# Patient Record
Sex: Female | Born: 1980 | Race: Black or African American | Hispanic: No | Marital: Single | Smoking: Current every day smoker
Health system: Southern US, Community
[De-identification: ages and names within clinical notes are randomized; demographics above are authoritative.]

## PROBLEM LIST (undated history)

## (undated) DIAGNOSIS — I10 Essential (primary) hypertension: Secondary | ICD-10-CM

---

## 2018-07-25 ENCOUNTER — Ambulatory Visit (HOSPITAL_COMMUNITY): Admission: EM | Admit: 2018-07-25 | Discharge: 2018-07-25 | Payer: Self-pay

## 2018-07-25 ENCOUNTER — Other Ambulatory Visit: Payer: Self-pay

## 2018-07-25 ENCOUNTER — Encounter (HOSPITAL_COMMUNITY): Payer: Self-pay | Admitting: *Deleted

## 2018-07-25 ENCOUNTER — Emergency Department (HOSPITAL_COMMUNITY)
Admission: EM | Admit: 2018-07-25 | Discharge: 2018-07-25 | Disposition: A | Discharge status: Home / Self Care | Payer: Self-pay | Attending: Emergency Medicine | Admitting: Emergency Medicine

## 2018-07-25 DIAGNOSIS — H1031 Unspecified acute conjunctivitis, right eye: Secondary | ICD-10-CM | POA: Insufficient documentation

## 2018-07-25 DIAGNOSIS — Z87891 Personal history of nicotine dependence: Secondary | ICD-10-CM | POA: Insufficient documentation

## 2018-07-25 MED ORDER — TOBRAMYCIN 0.3 % OP SOLN: 2.0000 [drp] | OPHTHALMIC | 0 refills | Status: DC

## 2018-07-25 NOTE — ED Notes (Signed)
Declined W/C at D/C and was escorted to lobby by RN. 

## 2018-07-25 NOTE — Discharge Instructions (Signed)
Return if any problems.

## 2018-07-25 NOTE — ED Triage Notes (Signed)
PT reports drainage from RT eye . Pt woke up with eye lid stuck together.

## 2018-07-26 NOTE — ED Provider Notes (Signed)
[facility] EMERGENCY DEPARTMENT Provider Note   CSN: [id number] Arrival date & time: 07/25/18  1625     History   Chief Complaint Chief Complaint  Patient presents with  . Eye Problem    HPI April Watkins is a 37 y.o. female.  The history is provided by the patient. A language interpreter was used.  Eye Problem   This is a new problem. The problem occurs constantly. The problem has been gradually worsening. There is a problem in the right eye. There was no injury mechanism. The pain is moderate. Associated symptoms include discharge and eye redness. Pertinent negatives include no blurred vision. She has tried nothing for the symptoms. The treatment provided no relief.    History reviewed. No pertinent past medical history.  There are no active problems to display for this patient.   History reviewed. No pertinent surgical history.   OB History    Gravida  1   Para      Term      Preterm      AB      Living        SAB      TAB      Ectopic      Multiple      Live Births               Home Medications    Prior to Admission medications   Medication Sig Start Date End Date Taking? Authorizing Provider  tobramycin (TOBREX) 0.3 % ophthalmic solution Place 2 drops into the right eye every 4 (four) hours. 07/25/18   Elson Areas, PA-C    Family History History reviewed. No pertinent family history.  Social History Social History   Tobacco Use  . Smoking status: Former Games developer  . Smokeless tobacco: Never Used  Substance Use Topics  . Alcohol use: Yes    Comment: social  . Drug use: Never     Allergies   Patient has no known allergies.   Review of Systems Review of Systems  Eyes: Positive for pain, discharge and redness. Negative for blurred vision.  All other systems reviewed and are negative.    Physical Exam Updated Vital Signs BP (!) 150/119 (BP Location: Left Arm)   Pulse 80   Temp 99.1 F (37.3 C)  (Oral)   Resp 16   Ht 5\' 2"  (1.575 m)   LMP 06/27/2018   SpO2 100%   Physical Exam  Constitutional: She appears well-developed and well-nourished.  HENT:  Head: Normocephalic.  Eyes: Pupils are equal, round, and reactive to light.  Injected conjunctiva   Neck: Normal range of motion.  Cardiovascular: Normal rate.  Pulmonary/Chest: Effort normal.  Musculoskeletal: Normal range of motion.  Neurological: She is alert.  Nursing note and vitals reviewed.    ED Treatments / Results  Labs (all labs ordered are listed, but only abnormal results are displayed) Labs Reviewed - No data to display  EKG None  Radiology No results found.  Procedures Procedures (including critical care time)  Medications Ordered in ED Medications - No data to display   Initial Impression / Assessment and Plan / ED Course  I have reviewed the triage vital signs and the nursing notes.  Pertinent labs & imaging results that were available during my care of the patient were reviewed by me and considered in my medical decision making (see chart for details).     MDM  Pt counseled on eye infection.  Rx  for tobrex.   Final Clinical Impressions(s) / ED Diagnoses   Final diagnoses:  Acute conjunctivitis of right eye, unspecified acute conjunctivitis type    ED Discharge Orders         Ordered    tobramycin (TOBREX) 0.3 % ophthalmic solution  Every 4 hours     07/25/18 1755        An After Visit Summary was printed and given to the patient.   Elson Areas, PA-C 07/26/18 4782    Gwyneth Sprout, MD 07/31/18 1422

## 2018-11-07 ENCOUNTER — Encounter (HOSPITAL_COMMUNITY): Payer: Self-pay

## 2018-11-07 ENCOUNTER — Ambulatory Visit (HOSPITAL_COMMUNITY)
Admission: EM | Admit: 2018-11-07 | Discharge: 2018-11-07 | Disposition: A | Discharge status: Home / Self Care | Payer: BLUE CROSS/BLUE SHIELD | Attending: Family Medicine | Admitting: Family Medicine

## 2018-11-07 DIAGNOSIS — K0889 Other specified disorders of teeth and supporting structures: Secondary | ICD-10-CM

## 2018-11-07 HISTORY — DX: Essential (primary) hypertension: I10

## 2018-11-07 MED ORDER — PENICILLIN V POTASSIUM 500 MG PO TABS: 500.0000 mg | ORAL_TABLET | Freq: Three times a day (TID) | ORAL | 0 refills | Status: DC

## 2018-11-07 MED ORDER — HYDROCODONE-ACETAMINOPHEN 5-325 MG PO TABS: 1.0000 | ORAL_TABLET | Freq: Four times a day (QID) | ORAL | 0 refills | Status: DC | PRN

## 2018-11-07 NOTE — ED Triage Notes (Signed)
Pt c/ tooth ache to lt lower tooth with abscess to lt side of face, swelling noted

## 2018-11-07 NOTE — Discharge Instructions (Addendum)
Be aware, pain medications may cause drowsiness. Please do not drive, operate heavy machinery or make important decisions while on this medication, it can cloud your judgement.  

## 2018-11-10 NOTE — ED Provider Notes (Signed)
[facility]   [id number] 11/07/18 Arrival Time: 1757  ASSESSMENT & PLAN:  1. Pain, dental     Meds ordered this encounter  Medications  . penicillin v potassium (VEETID) 500 MG tablet    Sig: Take 1 tablet (500 mg total) by mouth 3 (three) times daily.    Dispense:  30 tablet    Refill:  0  . HYDROcodone-acetaminophen (NORCO/VICODIN) 5-325 MG tablet    Sig: Take 1 tablet by mouth every 6 (six) hours as needed for moderate pain or severe pain.    Dispense:  8 tablet    Refill:  0   [place]rolled Substances Registry consulted for this patient. I feel the risk/benefit ratio today is favorable for proceeding with this prescription for a controlled substance. Medication sedation precautions given.  Dental resource written instructions given. She will schedule dental evaluation as soon as possible.  Reviewed expectations re: course of current medical issues. Questions answered. Outlined signs and symptoms indicating need for more acute intervention. Patient verbalized understanding. After Visit Summary given.   SUBJECTIVE:  April Watkins is a 38 y.o. female who reports gradual onset of left upper dental pain described as aching. Present for a few days; worse since yesterday. "Tooth broke recently." Fever: absent. Tolerating PO intake but reports pain with chewing. Normal swallowing. She does not see a dentist regularly. No neck swelling or pain. OTC analgesics without relief.  ROS: As per HPI.  OBJECTIVE: Vitals:   11/07/18 1841  BP: (!) 151/106  Pulse: 86  Resp: 18  Temp: 98.3 F (36.8 C)  TempSrc: Oral  SpO2: 98%    General appearance: alert; no distress HENT: normocephalic; atraumatic; dentition: poor; left upper gums without areas of fluctuance, drainage, or bleeding but tender to palpation; normal jaw movement without difficulty Neck: supple without LAD; FROM; trachea midline Lungs: normal respirations; unlabored Skin: warm and dry Psychological: alert  and cooperative; normal mood and affect  No Known Allergies  Past Medical History:  Diagnosis Date  . Hypertension    Social History   Socioeconomic History  . Marital status: Single    Spouse name: Not on file  . Number of children: Not on file  . Years of education: Not on file  . Highest education level: Not on file  Occupational History  . Not on file  Social Needs  . Financial resource strain: Not on file  . Food insecurity:    Worry: Not on file    Inability: Not on file  . Transportation needs:    Medical: Not on file    Non-medical: Not on file  Tobacco Use  . Smoking status: Former Games developer  . Smokeless tobacco: Never Used  Substance and Sexual Activity  . Alcohol use: Yes    Comment: social  . Drug use: Never  . Sexual activity: Yes    Birth control/protection: None  Lifestyle  . Physical activity:    Days per week: Not on file    Minutes per session: Not on file  . Stress: Not on file  Relationships  . Social connections:    Talks on phone: Not on file    Gets together: Not on file    Attends religious service: Not on file    Active member of club or organization: Not on file    Attends meetings of clubs or organizations: Not on file    Relationship status: Not on file  . Intimate partner violence:    Fear of current or  ex partner: Not on file    Emotionally abused: Not on file    Physically abused: Not on file    Forced sexual activity: Not on file  Other Topics Concern  . Not on file  Social History Narrative  . Not on file    History reviewed. No pertinent surgical history.   Mardella LaymanHagler, Esti Demello, MD 11/10/18 1010

## 2018-11-15 ENCOUNTER — Encounter (HOSPITAL_COMMUNITY): Payer: Self-pay | Admitting: Emergency Medicine

## 2018-11-15 ENCOUNTER — Ambulatory Visit (HOSPITAL_COMMUNITY)
Admission: EM | Admit: 2018-11-15 | Discharge: 2018-11-15 | Disposition: A | Discharge status: Home / Self Care | Payer: BLUE CROSS/BLUE SHIELD | Attending: Family Medicine | Admitting: Family Medicine

## 2018-11-15 DIAGNOSIS — K529 Noninfective gastroenteritis and colitis, unspecified: Secondary | ICD-10-CM | POA: Diagnosis not present

## 2018-11-15 MED ORDER — ONDANSETRON 4 MG PO TBDP: 4.0000 mg | ORAL_TABLET | Freq: Three times a day (TID) | ORAL | 0 refills | Status: DC | PRN

## 2018-11-15 NOTE — Discharge Instructions (Signed)

## 2018-11-15 NOTE — ED Triage Notes (Signed)
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 Worried she ate something bad.

## 2018-11-20 NOTE — ED Provider Notes (Signed)
[facility]   [id number] 11/15/18 Arrival Time: 1943  ASSESSMENT & PLAN:  1. Gastroenteritis    No signs of dehydration requiring IVF. Discussed.  Meds ordered this encounter  Medications  . ondansetron (ZOFRAN-ODT) 4 MG disintegrating tablet    Sig: Take 1 tablet (4 mg total) by mouth every 8 (eight) hours as needed for nausea or vomiting.    Dispense:  15 tablet    Refill:  0   Discussed typical duration of symptoms for suspected viral GI illness. Will do her best to ensure adequate fluid intake in order to avoid dehydration. Will proceed to the Emergency Department for evaluation if unable to tolerate PO fluids regularly.  Otherwise she will f/u with her PCP or here if not showing improvement over the next 48-72 hours.  Reviewed expectations re: course of current medical issues. Questions answered. Outlined signs and symptoms indicating need for more acute intervention. Patient verbalized understanding. After Visit Summary given.   SUBJECTIVE: History from: patient.  April Watkins is a 38 y.o. female who presents with complaint of non-bilious, non-bloody persistent n/v with non-bloody diarrhea. Onset today. Abdominal discomfort: mild and cramping. Symptoms are unchanged since beginning. Aggravating factors: eating. Alleviating factors: none. Associated symptoms: fatigue. She denies arthralgias, belching, chills, fever, myalgias and sweats. Appetite: decreased. PO intake: decreased. Ambulatory without assistance. Urinary symptoms: none. Sick contacts: none. Recent travel or camping: none. OTC treatment: none.  Occasionally smokes THC; has helped with nausea.  Patient's last menstrual period was 10/31/2018.  History reviewed. No pertinent surgical history.  ROS: As per HPI. All other systems negative   OBJECTIVE:  Vitals:   11/15/18 1946 11/15/18 1948  BP:  (!) 165/117  Pulse: 92   Resp: 18   Temp: 98.5 F (36.9 C)   SpO2: 100%     General  appearance: alert; no distress Oropharynx: moist Lungs: clear to auscultation bilaterally; unlabored Heart: regular rate and rhythm Abdomen: soft; non-distended; no significant abdominal tenderness; reports "cramping" feeling; bowel sounds present; no masses or organomegaly; no guarding or rebound tenderness Back: no CVA tenderness Extremities: no edema; symmetrical with no gross deformities Skin: warm; dry Neurologic: normal gait Psychological: alert and cooperative; normal mood and affect   No Known Allergies                                             Past Medical History:  Diagnosis Date  . Hypertension    Social History   Socioeconomic History  . Marital status: Single    Spouse name: Not on file  . Number of children: Not on file  . Years of education: Not on file  . Highest education level: Not on file  Occupational History  . Not on file  Social Needs  . Financial resource strain: Not on file  . Food insecurity:    Worry: Not on file    Inability: Not on file  . Transportation needs:    Medical: Not on file    Non-medical: Not on file  Tobacco Use  . Smoking status: Former Games developer  . Smokeless tobacco: Never Used  Substance and Sexual Activity  . Alcohol use: Yes    Comment: social  . Drug use: Never  . Sexual activity: Yes    Birth control/protection: None  Lifestyle  . Physical activity:    Days per week: Not on file  Minutes per session: Not on file  . Stress: Not on file  Relationships  . Social connections:    Talks on phone: Not on file    Gets together: Not on file    Attends religious service: Not on file    Active member of club or organization: Not on file    Attends meetings of clubs or organizations: Not on file    Relationship status: Not on file  . Intimate partner violence:    Fear of current or ex partner: Not on file    Emotionally abused: Not on file    Physically abused: Not on file    Forced sexual activity: Not on file    Other Topics Concern  . Not on file  Social History Narrative  . Not on file   No family history on file.   Mardella Layman, MD 11/20/18 [phone]

## 2019-12-20 ENCOUNTER — Other Ambulatory Visit: Payer: Self-pay

## 2019-12-20 ENCOUNTER — Ambulatory Visit (HOSPITAL_COMMUNITY)
Admission: EM | Admit: 2019-12-20 | Discharge: 2019-12-20 | Disposition: A | Discharge status: Home / Self Care | Payer: Self-pay | Attending: Physician Assistant | Admitting: Physician Assistant

## 2019-12-20 ENCOUNTER — Encounter (HOSPITAL_COMMUNITY): Payer: Self-pay

## 2019-12-20 DIAGNOSIS — L02412 Cutaneous abscess of left axilla: Secondary | ICD-10-CM

## 2019-12-20 MED ORDER — DOXYCYCLINE HYCLATE 100 MG PO CAPS: 100.0000 mg | ORAL_CAPSULE | Freq: Two times a day (BID) | ORAL | 0 refills | Status: AC

## 2019-12-20 MED ORDER — LIDOCAINE-EPINEPHRINE (PF) 2 %-1:200000 IJ SOLN
INTRAMUSCULAR | Status: AC
  Filled 2019-12-20: qty 20

## 2019-12-20 NOTE — ED Provider Notes (Signed)
[place]    CSN: [id number] Arrival date & time: 12/20/19  1222      History   Chief Complaint Chief Complaint  Patient presents with  . Abscess    HPI April Watkins is a 39 y.o. female.   Patient reports for assessment of a abscess in her left axilla.  She reports this been here for at least a week.  She reports occasional drainage and then reaccumulation.  This time it became more painful and was not draining.  She denies fever and chills.  She reports history of having abscesses in other locations on her body.  However she specifically denies having history of axillary abscess.     Past Medical History:  Diagnosis Date  . Hypertension     There are no problems to display for this patient.   History reviewed. No pertinent surgical history.  OB History    Gravida  1   Para      Term      Preterm      AB      Living        SAB      TAB      Ectopic      Multiple      Live Births               Home Medications    Prior to Admission medications   Medication Sig Start Date End Date Taking? Authorizing Provider  doxycycline (VIBRAMYCIN) 100 MG capsule Take 1 capsule (100 mg total) by mouth 2 (two) times daily for 7 days. 12/20/19 12/27/19  Tecora Eustache, Marguerita Beards, PA-C  HYDROcodone-acetaminophen (NORCO/VICODIN) 5-325 MG tablet Take 1 tablet by mouth every 6 (six) hours as needed for moderate pain or severe pain. Patient not taking: Reported on 11/15/2018 11/07/18   Vanessa Kick, MD  ondansetron (ZOFRAN-ODT) 4 MG disintegrating tablet Take 1 tablet (4 mg total) by mouth every 8 (eight) hours as needed for nausea or vomiting. 11/15/18   Vanessa Kick, MD    Family History Family History  Problem Relation Age of Onset  . Heart failure Mother   . Hypertension Mother     Social History Social History   Tobacco Use  . Smoking status: Former Research scientist (life sciences)  . Smokeless tobacco: Never Used  Substance Use Topics  . Alcohol use: Yes    Comment: social   . Drug use: Never     Allergies   Patient has no known allergies.   Review of Systems Review of Systems  Constitutional: Negative for chills and fever.  Skin: Positive for color change and wound. Negative for rash.       Abscess     Physical Exam Triage Vital Signs ED Triage Vitals  Enc Vitals Group     BP 12/20/19 1244 (!) 146/90     Pulse Rate 12/20/19 1244 (!) 101     Resp 12/20/19 1244 19     Temp 12/20/19 1244 98.9 F (37.2 C)     Temp Source 12/20/19 1244 Oral     SpO2 12/20/19 1244 99 %     Weight 12/20/19 1241 186 lb (84.4 kg)     Height --      Head Circumference --      Peak Flow --      Pain Score 12/20/19 1240 6     Pain Loc --      Pain Edu? --      Excl. in [place]? --  No data found.  Updated Vital Signs BP (!) 146/90 (BP Location: Right Arm)   Pulse (!) 101   Temp 98.9 F (37.2 C) (Oral)   Resp 19   Wt 186 lb (84.4 kg)   LMP 11/29/2019   SpO2 99%   Breastfeeding No   BMI 34.02 kg/m   Visual Acuity Right Eye Distance:   Left Eye Distance:   Bilateral Distance:    Right Eye Near:   Left Eye Near:    Bilateral Near:     Physical Exam Vitals and nursing note reviewed.  Constitutional:      General: She is not in acute distress.    Appearance: She is well-developed.  HENT:     Head: Normocephalic and atraumatic.  Eyes:     Conjunctiva/sclera: Conjunctivae normal.  Cardiovascular:     Rate and Rhythm: Normal rate.  Pulmonary:     Effort: Pulmonary effort is normal. No respiratory distress.  Musculoskeletal:     Cervical back: Neck supple.  Skin:    General: Skin is warm and dry.     Comments: 2 cm x 1 cm area of induration with central fluctuance in the left axilla.  No active drainage  Neurological:     General: No focal deficit present.     Mental Status: She is alert and oriented to person, place, and time.      UC Treatments / Results  Labs (all labs ordered are listed, but only abnormal results are displayed) Labs  Reviewed - No data to display  EKG   Radiology No results found.  Procedures Incision and Drainage  Date/Time: 12/20/2019 3:06 PM Performed by: Hermelinda Medicus, PA-C Authorized by: Hermelinda Medicus, PA-C   Consent:    Consent obtained:  Verbal   Consent given by:  Patient   Risks discussed:  Bleeding, incomplete drainage, infection and pain   Alternatives discussed:  No treatment Location:    Type:  Abscess   Location: Left axilla. Pre-procedure details:    Skin preparation:  Antiseptic wash and Betadine Anesthesia (see MAR for exact dosages):    Anesthesia method:  Local infiltration   Local anesthetic:  Lidocaine 2% WITH epi Procedure type:    Complexity:  Simple Procedure details:    Needle aspiration: no     Incision types:  Single straight   Incision depth:  Subcutaneous   Scalpel blade:  11   Wound management:  Probed and deloculated and irrigated with saline   Drainage:  Purulent and bloody   Drainage amount:  Moderate   Wound treatment:  Wound left open   Packing materials:  None Post-procedure details:    Patient tolerance of procedure:  Tolerated well, no immediate complications   (including critical care time)  Medications Ordered in UC Medications - No data to display  Initial Impression / Assessment and Plan / UC Course  I have reviewed the triage vital signs and the nursing notes.  Pertinent labs & imaging results that were available during my care of the patient were reviewed by me and considered in my medical decision making (see chart for details).     #Abscess left axilla Patient is a 39 year old female presenting with abscess of the left axilla.  Incision and drainage performed.  Wound bandaged and hemostasis obtained.  Doxycycline twice daily for 7 days prescribed.  Follow-up and return precautions were discussed.  Patient verbalized understanding.  Final Clinical Impressions(s) / UC Diagnoses   Final diagnoses:  Abscess of  left axilla      Discharge Instructions     Take the doxycycline twice a day for 7 days Change the bandages daily and keep the area clean.  You may shower and use soap and water to clean the area.  Some drainage the next 2 days is anticipated I would like for you to massage the area. Some soreness as expected.  May take Tylenol for the pain  If you notice regrowth significantly increasing pain in 1 week please return for reassessment.      ED Prescriptions    Medication Sig Dispense Auth. Provider   doxycycline (VIBRAMYCIN) 100 MG capsule Take 1 capsule (100 mg total) by mouth 2 (two) times daily for 7 days. 14 capsule [place], Veryl Speak, PA-C     PDMP not reviewed this encounter.   Hermelinda Medicus, PA-C 12/20/19 [phone]

## 2019-12-20 NOTE — ED Triage Notes (Signed)
Pt is here with an abscess that started a few weeks ago, states she thinks its infected.

## 2019-12-20 NOTE — Discharge Instructions (Addendum)
Take the doxycycline twice a day for 7 days Change the bandages daily and keep the area clean.  You may shower and use soap and water to clean the area.  Some drainage the next 2 days is anticipated I would like for you to massage the area. Some soreness as expected.  May take Tylenol for the pain  If you notice regrowth significantly increasing pain in 1 week please return for reassessment.

## 2020-09-10 ENCOUNTER — Emergency Department (HOSPITAL_COMMUNITY)
Admission: EM | Admit: 2020-09-10 | Discharge: 2020-09-11 | Disposition: A | Discharge status: Home / Self Care | Payer: Self-pay | Attending: Emergency Medicine | Admitting: Emergency Medicine

## 2020-09-10 ENCOUNTER — Encounter (HOSPITAL_COMMUNITY): Payer: Self-pay | Admitting: Emergency Medicine

## 2020-09-10 ENCOUNTER — Emergency Department (HOSPITAL_COMMUNITY): Payer: Self-pay

## 2020-09-10 ENCOUNTER — Other Ambulatory Visit: Payer: Self-pay

## 2020-09-10 DIAGNOSIS — M79671 Pain in right foot: Secondary | ICD-10-CM | POA: Insufficient documentation

## 2020-09-10 DIAGNOSIS — Z87891 Personal history of nicotine dependence: Secondary | ICD-10-CM | POA: Insufficient documentation

## 2020-09-10 DIAGNOSIS — M79672 Pain in left foot: Secondary | ICD-10-CM | POA: Insufficient documentation

## 2020-09-10 DIAGNOSIS — R1013 Epigastric pain: Secondary | ICD-10-CM | POA: Insufficient documentation

## 2020-09-10 DIAGNOSIS — I10 Essential (primary) hypertension: Secondary | ICD-10-CM | POA: Insufficient documentation

## 2020-09-10 DIAGNOSIS — R0602 Shortness of breath: Secondary | ICD-10-CM | POA: Insufficient documentation

## 2020-09-10 DIAGNOSIS — Z79899 Other long term (current) drug therapy: Secondary | ICD-10-CM | POA: Insufficient documentation

## 2020-09-10 LAB — CBC WITH DIFFERENTIAL/PLATELET
Abs Immature Granulocytes: 0.03 10*3/uL (ref 0.00–0.07)
Basophils Absolute: 0 10*3/uL (ref 0.0–0.1)
Basophils Relative: 0 %
Eosinophils Absolute: 0.1 10*3/uL (ref 0.0–0.5)
Eosinophils Relative: 1 %
HCT: 36.1 % (ref 36.0–46.0)
Hemoglobin: 13.5 g/dL (ref 12.0–15.0)
Immature Granulocytes: 0 %
Lymphocytes Relative: 40 %
Lymphs Abs: 4.4 10*3/uL — ABNORMAL HIGH (ref 0.7–4.0)
MCH: 31.8 pg (ref 26.0–34.0)
MCHC: 37.4 g/dL — ABNORMAL HIGH (ref 30.0–36.0)
MCV: 85.1 fL (ref 80.0–100.0)
Monocytes Absolute: 0.7 10*3/uL (ref 0.1–1.0)
Monocytes Relative: 6 %
Neutro Abs: 5.6 10*3/uL (ref 1.7–7.7)
Neutrophils Relative %: 53 %
Platelets: 269 10*3/uL (ref 150–400)
RBC: 4.24 MIL/uL (ref 3.87–5.11)
RDW: 13.3 % (ref 11.5–15.5)
WBC: 10.8 10*3/uL — ABNORMAL HIGH (ref 4.0–10.5)
nRBC: 0 % (ref 0.0–0.2)

## 2020-09-10 LAB — BASIC METABOLIC PANEL
Anion gap: 10 (ref 5–15)
BUN: 13 mg/dL (ref 6–20)
CO2: 25 mmol/L (ref 22–32)
Calcium: 9.4 mg/dL (ref 8.9–10.3)
Chloride: 101 mmol/L (ref 98–111)
Creatinine, Ser: 0.96 mg/dL (ref 0.44–1.00)
GFR, Estimated: >60 mL/min (ref >60)
Glucose, Bld: 89 mg/dL (ref 70–99)
Potassium: 3.4 mmol/L — ABNORMAL LOW (ref 3.5–5.1)
Sodium: 136 mmol/L (ref 135–145)

## 2020-09-10 LAB — I-STAT BETA HCG BLOOD, ED (MC, WL, AP ONLY): I-stat hCG, quantitative: <5 m[IU]/mL (ref <5)

## 2020-09-10 MED ORDER — AMLODIPINE BESYLATE 5 MG PO TABS
5.0000 mg | ORAL_TABLET | Freq: Once | ORAL | Status: AC
  Administered 2020-09-11: 5 mg via ORAL
  Filled 2020-09-10: qty 1

## 2020-09-10 MED ORDER — HYDROCODONE-ACETAMINOPHEN 5-325 MG PO TABS
1.0000 | ORAL_TABLET | Freq: Once | ORAL | Status: AC
  Administered 2020-09-11: 1 via ORAL
  Filled 2020-09-10: qty 1

## 2020-09-10 NOTE — ED Triage Notes (Signed)
Patient reports bilateral legs pain more at right onset 2 months ago , denies injury /ambulatory.

## 2020-09-11 LAB — HEPATIC FUNCTION PANEL
ALT: 26 U/L (ref 0–44)
AST: 22 U/L (ref 15–41)
Albumin: 3.8 g/dL (ref 3.5–5.0)
Alkaline Phosphatase: 48 U/L (ref 38–126)
Bilirubin, Direct: <0.1 mg/dL (ref 0.0–0.2)
Total Bilirubin: 0.6 mg/dL (ref 0.3–1.2)
Total Protein: 7.7 g/dL (ref 6.5–8.1)

## 2020-09-11 LAB — TROPONIN I (HIGH SENSITIVITY): Troponin I (High Sensitivity): 3 ng/L (ref <18)

## 2020-09-11 LAB — LIPASE, BLOOD: Lipase: 25 U/L (ref 11–51)

## 2020-09-11 NOTE — ED Notes (Signed)
Pt refused EKG after pt and family member kept asking for EKG to be done

## 2020-09-11 NOTE — ED Notes (Addendum)
Pt left ED without being discharged. Vitals and pain assessment unable to be done.

## 2020-09-11 NOTE — ED Provider Notes (Signed)
[facility] EMERGENCY DEPARTMENT Provider Note   CSN: [id number] Arrival date & time: 09/10/20  2003     History Chief Complaint  Patient presents with  . Leg Pain    April Watkins is a 39 y.o. female.  HPI   Patient is a 39 year old female with history of hypertension who presents to the emergency department today for evaluation of various complaints.  She states that she has been having pain to her bilateral heels for about a month.  She has pain when she ambulates.  She has bought several different pairs of shoes to help with her symptoms however has not been improved.  She also reports intermittent tingling to her hands and feet.  States she feels like she is not getting circulation to her legs.  She is also complaining of chest pain and some shortness of breath.  She has epigastric abdominal pain as well.  States she has a history of hypertension but has not taken her blood pressure medication today.  She reports that she is not always compliant with it.  Patient has another person at the bedside who states that she had a seizure in her sleep a few days ago.  She does not think that the patient was having a dream.  She states she was shaking all over.  Patient admits to being on Tegretol in the past but is not currently.  Past Medical History:  Diagnosis Date  . Hypertension     There are no problems to display for this patient.   History reviewed. No pertinent surgical history.   OB History    Gravida  1   Para      Term      Preterm      AB      Living        SAB      TAB      Ectopic      Multiple      Live Births              Family History  Problem Relation Age of Onset  . Heart failure Mother   . Hypertension Mother     Social History   Tobacco Use  . Smoking status: Former Games developer  . Smokeless tobacco: Never Used  Vaping Use  . Vaping Use: Never used  Substance Use Topics  . Alcohol use: Yes    Comment: social  .  Drug use: Yes    Types: Marijuana    Home Medications Prior to Admission medications   Medication Sig Start Date End Date Taking? Authorizing Provider  HYDROcodone-acetaminophen (NORCO/VICODIN) 5-325 MG tablet Take 1 tablet by mouth every 6 (six) hours as needed for moderate pain or severe pain. Patient not taking: Reported on 11/15/2018 11/07/18   Mardella Layman, MD  ondansetron (ZOFRAN-ODT) 4 MG disintegrating tablet Take 1 tablet (4 mg total) by mouth every 8 (eight) hours as needed for nausea or vomiting. 11/15/18   Mardella Layman, MD    Allergies    Patient has no known allergies.  Review of Systems   Review of Systems  Constitutional: Negative for chills and fever.  HENT: Negative for ear pain and sore throat.   Eyes: Negative for pain and visual disturbance.  Respiratory: Positive for shortness of breath. Negative for cough.   Cardiovascular: Positive for chest pain.  Gastrointestinal: Positive for abdominal pain. Negative for diarrhea, nausea and vomiting.  Genitourinary: Negative for dysuria and hematuria.  Musculoskeletal: Negative  for back pain.       Bilat foot pain  Skin: Negative for rash.  Neurological: Negative for headaches.  All other systems reviewed and are negative.   Physical Exam Updated Vital Signs BP (!) 174/100   Pulse 99   Temp 98.7 F (37.1 C) (Oral)   Resp 19   Ht 5\' 7"  (1.702 m)   Wt 82 kg   LMP 09/06/2020   SpO2 98%   BMI 28.31 kg/m   Physical Exam Vitals and nursing note reviewed.  Constitutional:      General: She is not in acute distress.    Appearance: She is well-developed.  HENT:     Head: Normocephalic and atraumatic.  Eyes:     Conjunctiva/sclera: Conjunctivae normal.  Cardiovascular:     Rate and Rhythm: Normal rate and regular rhythm.     Heart sounds: Normal heart sounds. No murmur heard.   Pulmonary:     Effort: Pulmonary effort is normal. No respiratory distress.     Breath sounds: Normal breath sounds. No wheezing,  rhonchi or rales.  Abdominal:     General: Bowel sounds are normal.     Palpations: Abdomen is soft.     Tenderness: There is abdominal tenderness ( Epigastric and right upper quadrant tenderness).  Musculoskeletal:     Cervical back: Neck supple.     Comments: Reports pain to the bilateral heels however no focal tenderness on exam.  No swelling to the feet and no edema to the bilateral lower extremities.  No calf tenderness bilaterally.  DP pulses 2+ and symmetric.  Skin:    General: Skin is warm and dry.  Neurological:     Mental Status: She is alert.     Comments: Mental Status:  Alert, thought content appropriate, able to give a coherent history. Speech fluent without evidence of aphasia. Able to follow 2 step commands without difficulty.  Cranial Nerves:  II:   pupils equal, round, reactive to light III,IV, VI: ptosis not present, extra-ocular motions intact bilaterally  V,VII: smile symmetric, facial light touch sensation equal VIII: hearing grossly normal to voice  X: uvula elevates symmetrically  XI: bilateral shoulder shrug symmetric and strong XII: midline tongue extension without fassiculations Motor:  Normal tone. 5/5 strength of BUE and BLE major muscle groups including strong and equal grip strength and dorsiflexion/plantar flexion Sensory: light touch normal in all extremities.      ED Results / Procedures / Treatments   Labs (all labs ordered are listed, but only abnormal results are displayed) Labs Reviewed  CBC WITH DIFFERENTIAL/PLATELET - Abnormal; Notable for the following components:      Result Value   WBC 10.8 (*)    MCHC 37.4 (*)    Lymphs Abs 4.4 (*)    All other components within normal limits  BASIC METABOLIC PANEL - Abnormal; Notable for the following components:   Potassium 3.4 (*)    All other components within normal limits  HEPATIC FUNCTION PANEL  LIPASE, BLOOD  I-STAT BETA HCG BLOOD, ED ([facility], [facility], [facility] ONLY)  TROPONIN I (HIGH SENSITIVITY)     EKG None  Radiology DG Chest 1 View  Result Date: 09/10/2020 CLINICAL DATA:  Leg pain EXAM: CHEST  1 VIEW COMPARISON:  None. FINDINGS: The heart size and mediastinal contours are within normal limits. Both lungs are clear. The visualized skeletal structures are unremarkable. IMPRESSION: No active disease. Electronically Signed   By: 14/11/2019 M.D.   On: 09/10/2020 23:41  Procedures Procedures (including critical care time)  Medications Ordered in ED Medications  amLODipine (NORVASC) tablet 5 mg (5 mg Oral Given 09/11/20 0035)  HYDROcodone-acetaminophen (NORCO/VICODIN) 5-325 MG per tablet 1 tablet (1 tablet Oral Given 09/11/20 0035)    ED Course  I have reviewed the triage vital signs and the nursing notes.  Pertinent labs & imaging results that were available during my care of the patient were reviewed by me and considered in my medical decision making (see chart for details).    MDM Rules/Calculators/A&P                          39 year old female presenting here with multiple complaints.  She has pain to her bilateral heels but additionally she is noted to be very hypertensive and is also complaining of some chest pain and shortness of breath.  She also has some epigastric and right upper quadrant tenderness on exam.  At the time of my evaluation she has had a CBC and a BMP completed which are reassuring however I iterated that I would like to add additional labs which included liver enzymes, lipase, troponin.  Also recommended adding EKG and chest x-ray to further assess for any hypertensive crisis given blood pressures in the 190s systolic on my evaluation.  Additionally I advised the patient that I would order her some pain medications in order her home blood pressure medication.  She states that she is on amlodipine.  Amlodipine and Norco ordered and work-up initiated.  I later walked by the patient's hospital bed and realized that she was no longer in it and had left the  department.  I inquired with nursing staff what happened and they state that the patient eloped as she was unhappy with the weight.  They further state that she refused her EKG the remainder of the work-up  Final Clinical Impression(s) / ED Diagnoses Final diagnoses:  SOB (shortness of breath)    Rx / DC Orders ED Discharge Orders    None       Rayne Du 09/11/20 0156    Dione Booze, MD 09/11/20 [phone]

## 2021-09-10 ENCOUNTER — Ambulatory Visit: Payer: Self-pay | Admitting: Medical

## 2021-09-24 ENCOUNTER — Other Ambulatory Visit: Payer: Self-pay

## 2021-09-24 ENCOUNTER — Encounter: Payer: Self-pay | Admitting: Medical

## 2021-09-24 ENCOUNTER — Ambulatory Visit (INDEPENDENT_AMBULATORY_CARE_PROVIDER_SITE_OTHER): Payer: No Typology Code available for payment source | Admitting: Medical

## 2021-09-24 ENCOUNTER — Ambulatory Visit (HOSPITAL_BASED_OUTPATIENT_CLINIC_OR_DEPARTMENT_OTHER)
Admission: RE | Admit: 2021-09-24 | Discharge: 2021-09-24 | Disposition: A | Discharge status: Home / Self Care | Payer: No Typology Code available for payment source | Source: Ambulatory Visit | Attending: Medical | Admitting: Medical

## 2021-09-24 ENCOUNTER — Ambulatory Visit (HOSPITAL_BASED_OUTPATIENT_CLINIC_OR_DEPARTMENT_OTHER): Payer: No Typology Code available for payment source

## 2021-09-24 VITALS — BP 170/100 | HR 92 | Temp 98.2°F | Resp 18 | Ht 62.0 in | Wt 238.0 lb

## 2021-09-24 DIAGNOSIS — R0609 Other forms of dyspnea: Secondary | ICD-10-CM

## 2021-09-24 DIAGNOSIS — R1010 Upper abdominal pain, unspecified: Secondary | ICD-10-CM

## 2021-09-24 DIAGNOSIS — I10 Essential (primary) hypertension: Secondary | ICD-10-CM

## 2021-09-24 DIAGNOSIS — M2550 Pain in unspecified joint: Secondary | ICD-10-CM

## 2021-09-24 DIAGNOSIS — R600 Localized edema: Secondary | ICD-10-CM | POA: Insufficient documentation

## 2021-09-24 LAB — COMPREHENSIVE METABOLIC PANEL
ALT: 23 U/L (ref 0–35)
AST: 22 U/L (ref 0–37)
Albumin: 4.3 g/dL (ref 3.5–5.2)
Alkaline Phosphatase: 52 U/L (ref 39–117)
BUN: 8 mg/dL (ref 6–23)
CO2: 29 mEq/L (ref 19–32)
Calcium: 9.4 mg/dL (ref 8.4–10.5)
Chloride: 102 mEq/L (ref 96–112)
Creatinine, Ser: 0.73 mg/dL (ref 0.40–1.20)
GFR: 101.42 mL/min (ref >60.00)
Glucose, Bld: 95 mg/dL (ref 70–99)
Potassium: 3.8 mEq/L (ref 3.5–5.1)
Sodium: 138 mEq/L (ref 135–145)
Total Bilirubin: 0.7 mg/dL (ref 0.2–1.2)
Total Protein: 7.7 g/dL (ref 6.0–8.3)

## 2021-09-24 LAB — CBC WITH DIFFERENTIAL/PLATELET
Basophils Absolute: 0 10*3/uL (ref 0.0–0.1)
Basophils Relative: 0.5 % (ref 0.0–3.0)
Eosinophils Absolute: 0.1 10*3/uL (ref 0.0–0.7)
Eosinophils Relative: 0.7 % (ref 0.0–5.0)
HCT: 40.8 % (ref 36.0–46.0)
Hemoglobin: 14.1 g/dL (ref 12.0–15.0)
Lymphocytes Relative: 40.4 % (ref 12.0–46.0)
Lymphs Abs: 3 10*3/uL (ref 0.7–4.0)
MCHC: 34.5 g/dL (ref 30.0–36.0)
MCV: 89.9 fl (ref 78.0–100.0)
Monocytes Absolute: 0.5 10*3/uL (ref 0.1–1.0)
Monocytes Relative: 7.2 % (ref 3.0–12.0)
Neutro Abs: 3.8 10*3/uL (ref 1.4–7.7)
Neutrophils Relative %: 51.2 % (ref 43.0–77.0)
Platelets: 230 10*3/uL (ref 150.0–400.0)
RBC: 4.54 Mil/uL (ref 3.87–5.11)
RDW: 13.9 % (ref 11.5–15.5)
WBC: 7.4 10*3/uL (ref 4.0–10.5)

## 2021-09-24 LAB — BRAIN NATRIURETIC PEPTIDE: Pro B Natriuretic peptide (BNP): 34 pg/mL (ref 0.0–100.0)

## 2021-09-24 LAB — LIPID PANEL
Cholesterol: 151 mg/dL (ref 0–200)
HDL: 66.8 mg/dL (ref >39.00)
LDL Cholesterol: 70 mg/dL (ref 0–99)
NonHDL: 84.5
Total CHOL/HDL Ratio: 2
Triglycerides: 72 mg/dL (ref 0.0–149.0)
VLDL: 14.4 mg/dL (ref 0.0–40.0)

## 2021-09-24 LAB — SEDIMENTATION RATE: Sed Rate: 2 mm/hr (ref 0–20)

## 2021-09-24 LAB — POCT URINE PREGNANCY: Preg Test, Ur: NEGATIVE

## 2021-09-24 LAB — C-REACTIVE PROTEIN: CRP: <1 mg/dL (ref 0.5–20.0)

## 2021-09-24 LAB — LIPASE: Lipase: 23 U/L (ref 11.0–59.0)

## 2021-09-24 MED ORDER — LOSARTAN POTASSIUM-HCTZ 100-12.5 MG PO TABS: 1.0000 | ORAL_TABLET | Freq: Every day | ORAL | 0 refills | Status: DC

## 2021-09-24 MED ORDER — AMLODIPINE BESYLATE 5 MG PO TABS: 5.0000 mg | ORAL_TABLET | Freq: Every day | ORAL | 0 refills | Status: DC

## 2021-09-24 NOTE — Progress Notes (Signed)
Subjective:    Patient ID: April Watkins, female    DOB: [birth year], 40 y.o.   MRN: [id number]  HPI  Pt in for first time.  Pt states she from [employer] and [place]. She has been trying to live her full time past 2 years. 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 Pt does not exercise regularly. Pt states heaviest she has every been.      Pt states both of her feet are swollen. Pt states she gets short of breath with going up steps.    Pt has recent small lump/bump just past knuckle on 3rd digit rt hand.  Pt has rash on her upper chest. Rash on chest off and on. Pt noticed this more in the summer. Pt tried some hydrocortisone to chest. Borrowed it from one of her friends.  Htn- pt is on amlodipine 5 mg daily. Pt bp is initially very high. No cardiac or neurologic signs or symptoms. She does not remember what medication she used in the past. Sister can't remember.   2021 weighted 180. Now she is 239 lb.      Pt had some sob and evaluated in the ED. [id number]  "40 year old female presenting here with multiple complaints.  She has pain to her bilateral heels but additionally she is noted to be very hypertensive and is also complaining of some chest pain and shortness of breath.  She also has some epigastric and right upper quadrant tenderness on exam.  At the time of my evaluation she has had a CBC and a BMP completed which are reassuring however I iterated that I would like to add additional labs which included liver enzymes, lipase, troponin.  Also recommended adding EKG and chest x-ray to further assess for any hypertensive crisis given blood pressures in the [id number] systolic on my evaluation.  Additionally I advised the patient that I would order her some pain medications in order her home blood pressure medication.  She states that she is on amlodipine.  Amlodipine and Norco ordered and work-up initiated.   I later walked by the patient's hospital bed and realized that she  was no longer in it and had left the department.  I inquired with nursing staff what happened and they state that the patient eloped as she was unhappy with the weight.  They further state that she refused her EKG the remainder of the work-up"  Pt has not had any chest pain since ED visit last year.    LMP- last Monday monday but would be     Review of Systems  Constitutional:  Positive for unexpected weight change. Negative for chills, fatigue and fever.  HENT:  Negative for congestion.   Respiratory:  Positive for shortness of breath. Negative for cough, chest tightness and wheezing.   Cardiovascular:  Negative for chest pain and palpitations.  Gastrointestinal:  Negative for abdominal pain, blood in stool, diarrhea and nausea.  Genitourinary:  Negative for dysuria, frequency, hematuria and urgency.  Musculoskeletal:  Negative for back pain.  Skin:  Negative for rash.     Past Medical History:  Diagnosis Date   Hypertension      Social History   Socioeconomic History   Marital status: Single    Spouse name: Not on file   Number of children: Not on file   Years of education: Not on file   Highest education level: Not on file  Occupational History   Not on file  Tobacco  Use   Smoking status: Former   Smokeless tobacco: Never  Scientific laboratory technician Use: Never used  Substance and Sexual Activity   Alcohol use: Yes    Comment: social   Drug use: Yes    Types: Marijuana   Sexual activity: Yes    Birth control/protection: None  Other Topics Concern   Not on file  Social History Narrative   Not on file   Social Determinants of Health   Financial Resource Strain: Not on file  Food Insecurity: Not on file  Transportation Needs: Not on file  Physical Activity: Not on file  Stress: Not on file  Social Connections: Not on file  Intimate Partner Violence: Not on file    No past surgical history on file.  Family History  Problem Relation Age of Onset   Heart  failure Mother    Hypertension Mother     No Known Allergies  No current outpatient medications on file prior to visit.   No current facility-administered medications on file prior to visit.    BP (!) 181/100    Pulse 92    Temp 98.2 F (36.8 C)    Resp 18    Ht 5\' 2"  (1.575 m)    Wt 238 lb (108 kg)    SpO2 95%    BMI 43.53 kg/m       Objective:   Physical Exam  General Mental Status- Alert. General Appearance- Not in acute distress.   Skin General: Color- Normal Color. Moisture- Normal Moisture.  Neck Carotid Arteries- Normal color. Moisture- Normal Moisture. No carotid bruits. No JVD.  Chest and Lung Exam Auscultation: Breath Sounds:-Normal.  Cardiovascular Auscultation:Rythm- Regular. Murmurs & Other Heart Sounds:Auscultation of the heart reveals- No Murmurs.  Abdomen Inspection:-Inspeection Normal. Palpation/Percussion:Note:No mass. Palpation and Percussion of the abdomen reveal- Non Tender, Non Distended + BS, no rebound or guarding.    Neurologic Cranial Nerve exam:- CN III-XII intact(No nystagmus), symmetric smile. Drift Test:- No drift. Strength:- 5/5 equal and symmetric strength both upper and lower extremities.    Lower ext- faint pedeal edema bilaterally but negative homans signs.  Feet- mild tender area back of heel on both feet at achilles tendone insertion site.    Assessment & Plan:   Patient Instructions  Hypertension.  BP moderate to severe high/in the hypertensive urgency range.  We will get CMP today.  EKG showed sinus rhythm with no acute ischemia type changes.  Does not appear to have LVH.  Normal neurologic exam.  Continue amlodipine 5 mg daily and adding losartan 100/12.5 mg daily.  If you develop any cardiac or neurologic signs symptoms recommend/advised be seen in the emergency department.  Pain intermittent in the right upper quadrant/abdomen area.  No pain presently.  For this we will get lipase and metabolic panel/liver enzymes.  We  will follow those lab results.  Might consider imaging such as abdomen ultrasound.  Recommend eating healthy.  Avoiding caffeinated beverages, chocolate, fried foods and alcohol.  If he has any reflux type symptoms can use famotidine/Pepcid over-the-counter.  Chronic dyspnea on exertion.  New to our office so we will order a chest x-ray and BNP lab.  Some of your dyspnea on exertion might be due to deconditioning and weight gain.  Will do chest x-ray to rule out any CHF.   Also decided to go ahead and order bilateral lower extremity ultrasounds.  Rule out DVT.  Bilateral hand pain/arthralgia.  Added inflammatory labs to studies.  For obesity  recommend starting diet.  I would recommend weight watchers diet.  If your weight is not dropping then would offer weight management referral.  Follow-up in 1 week nurse blood pressure check.  Sooner if needed.  Next week will be shortly due to her upcoming Christmas holiday.  So if issues do arise and not able to be seen then recommend ED evaluation.    Esperanza Richters, PA-C   Time spent with patient today was 46  minutes which consisted of chart review, discussing diagnosis, work up treatment and documentation.

## 2021-09-24 NOTE — Patient Instructions (Signed)
Hypertension.  BP moderate to severe high/in the hypertensive urgency range.  We will get CMP today.  EKG showed sinus rhythm with no acute ischemia type changes.  Does not appear to have LVH.  Normal neurologic exam.  Continue amlodipine 5 mg daily and adding losartan 100/12.5 mg daily.  If you develop any cardiac or neurologic signs symptoms recommend/advised be seen in the emergency department.  Pain intermittent in the right upper quadrant/abdomen area.  No pain presently.  For this we will get lipase and metabolic panel/liver enzymes.  We will follow those lab results.  Might consider imaging such as abdomen ultrasound.  Recommend eating healthy.  Avoiding caffeinated beverages, chocolate, fried foods and alcohol.  If he has any reflux type symptoms can use famotidine/Pepcid over-the-counter.  Chronic dyspnea on exertion.  New to our office so we will order a chest x-ray and BNP lab.  Some of your dyspnea on exertion might be due to deconditioning and weight gain.  Will do chest x-ray to rule out any CHF.   Also decided to go ahead and order bilateral lower extremity ultrasounds.  Rule out DVT.  Bilateral hand pain/arthralgia.  Added inflammatory labs to studies.  For obesity recommend starting diet.  I would recommend weight watchers diet.  If your weight is not dropping then would offer weight management referral.  Follow-up in 1 week nurse blood pressure check.  Sooner if needed.  Next week will be shortly due to her upcoming Christmas holiday.  So if issues do arise and not able to be seen then recommend ED evaluation.

## 2021-09-25 ENCOUNTER — Other Ambulatory Visit (HOSPITAL_BASED_OUTPATIENT_CLINIC_OR_DEPARTMENT_OTHER): Payer: No Typology Code available for payment source

## 2021-09-25 ENCOUNTER — Ambulatory Visit (HOSPITAL_BASED_OUTPATIENT_CLINIC_OR_DEPARTMENT_OTHER)
Admission: RE | Admit: 2021-09-25 | Discharge: 2021-09-25 | Disposition: A | Discharge status: Home / Self Care | Payer: No Typology Code available for payment source | Source: Ambulatory Visit | Attending: Medical | Admitting: Medical

## 2021-09-25 DIAGNOSIS — R0609 Other forms of dyspnea: Secondary | ICD-10-CM | POA: Diagnosis present

## 2021-09-25 DIAGNOSIS — R600 Localized edema: Secondary | ICD-10-CM | POA: Insufficient documentation

## 2021-09-27 LAB — RHEUMATOID FACTOR: Rhuematoid fact SerPl-aCnc: <14 IU/mL (ref <14)

## 2021-09-27 LAB — ANA: Anti Nuclear Antibody (ANA): NEGATIVE

## 2021-09-30 ENCOUNTER — Ambulatory Visit: Payer: No Typology Code available for payment source | Admitting: Medical

## 2021-09-30 ENCOUNTER — Encounter: Payer: Self-pay | Admitting: Medical

## 2021-09-30 VITALS — BP 150/90 | HR 98 | Resp 18 | Ht 62.0 in | Wt 236.0 lb

## 2021-09-30 DIAGNOSIS — R519 Headache, unspecified: Secondary | ICD-10-CM

## 2021-09-30 DIAGNOSIS — R350 Frequency of micturition: Secondary | ICD-10-CM

## 2021-09-30 DIAGNOSIS — B029 Zoster without complications: Secondary | ICD-10-CM | POA: Diagnosis not present

## 2021-09-30 DIAGNOSIS — I10 Essential (primary) hypertension: Secondary | ICD-10-CM | POA: Diagnosis not present

## 2021-09-30 DIAGNOSIS — Z124 Encounter for screening for malignant neoplasm of cervix: Secondary | ICD-10-CM

## 2021-09-30 LAB — POC URINALSYSI DIPSTICK (AUTOMATED)
Bilirubin, UA: NEGATIVE
Blood, UA: NEGATIVE
Glucose, UA: NEGATIVE
Ketones, UA: NEGATIVE
Leukocytes, UA: NEGATIVE
Nitrite, UA: NEGATIVE
Protein, UA: NEGATIVE
Spec Grav, UA: 1.02 (ref 1.010–1.025)
Urobilinogen, UA: 0.2 E.U./dL
pH, UA: 6.5 (ref 5.0–8.0)

## 2021-09-30 MED ORDER — TRAMADOL HCL 50 MG PO TABS: 50.0000 mg | ORAL_TABLET | Freq: Three times a day (TID) | ORAL | 0 refills | Status: AC | PRN

## 2021-09-30 MED ORDER — AMLODIPINE BESYLATE 10 MG PO TABS: 10.0000 mg | ORAL_TABLET | Freq: Every day | ORAL | 0 refills | Status: DC

## 2021-09-30 MED ORDER — FAMCICLOVIR 500 MG PO TABS: 500.0000 mg | ORAL_TABLET | Freq: Three times a day (TID) | ORAL | 0 refills | Status: DC

## 2021-09-30 MED ORDER — AMOXICILLIN-POT CLAVULANATE 875-125 MG PO TABS: 1.0000 | ORAL_TABLET | Freq: Two times a day (BID) | ORAL | 0 refills | Status: DC

## 2021-09-30 NOTE — Patient Instructions (Addendum)
Your blood pressure was 150/90 today when I double checked.  You also I thought he remembered systolic/top number being in the 150 range as well.  Last night describe severe stressful event and was still upset when he first came to our office blood pressure was high when you were talking about the stressful event.  I would recommend that you continue losartan/HCTZ and amlodipine.  However advise increasing amlodipine to 10 mg daily.  This will give you better blood pressure control.  Check blood pressures daily over the next week.  Make sure that you are relaxed environment before checking.  If you would call and give me update on those readings.  If by chance you have any very high readings with any motor or neurologic signs and symptoms and recommend ED evaluation.  Recent frequent urination.  I do not think it is related to her diuretic as you are on low-dose.  We will get repeat metabolic panel, urinalysis and urine culture.  Left-sided facial pain over the last 2 days.  Pain could be related to tooth infection or possible shingles.  Approaching long holiday weekend and decided to go ahead and prescribe the Famvir antiviral and Augmentin antibiotic.  Would recommend that you do establish care with dentist to evaluate your teeth.  If any severe nerve type like pain then making limited number of tramadol available.  Note tramadol can cause sedation.  Follow up in 7 days or sooner if needed.

## 2021-09-30 NOTE — Progress Notes (Signed)
Subjective:    Patient ID: April Watkins, female    DOB: Jun 25, 1981, 40 y.o.   MRN: [id number]  HPI  Pt in with persisting high blood pressure today.   Over past week she estimates that her blood pressure was 130-150/80. She can't remember exactly. Left sheet at home. She thinks after more thought bp was 150/80. She is taking her bp medicatoin in the morning.  Initially very high bp level. Pt states she got in argument with her girlfriend. She had to leave house and had to go home/slept at moms house. She barely got any sleep last night.  2 days ago pt got some left side face pain. She attributes this to a tooth. No pain when she eats or chews.   Pt over past week has been thirsty and urinating more.    Review of Systems  Constitutional:  Negative for chills, fatigue and fever.  Respiratory:  Negative for cough, chest tightness, shortness of breath and wheezing.   Cardiovascular:  Negative for chest pain and palpitations.  Gastrointestinal:  Negative for abdominal pain, constipation, diarrhea and nausea.  Genitourinary:  Positive for frequency and urgency. Negative for dysuria and flank pain.  Musculoskeletal:  Negative for back pain, joint swelling and myalgias.  Skin:  Negative for rash.  Neurological:  Negative for dizziness, seizures, weakness, numbness and headaches.       Facial pain.  Hematological:  Negative for adenopathy. Does not bruise/bleed easily.  Psychiatric/Behavioral:  Negative for behavioral problems, confusion and dysphoric mood. The patient is not nervous/anxious.    Past Medical History:  Diagnosis Date   Hypertension      Social History   Socioeconomic History   Marital status: Single    Spouse name: Not on file   Number of children: Not on file   Years of education: Not on file   Highest education level: Not on file  Occupational History   Not on file  Tobacco Use   Smoking status: Former   Smokeless tobacco: Never  Vaping Use   Vaping Use:  Never used  Substance and Sexual Activity   Alcohol use: Yes    Comment: social   Drug use: Yes    Types: Marijuana   Sexual activity: Yes    Birth control/protection: None  Other Topics Concern   Not on file  Social History Narrative   Not on file   Social Determinants of Health   Financial Resource Strain: Not on file  Food Insecurity: Not on file  Transportation Needs: Not on file  Physical Activity: Not on file  Stress: Not on file  Social Connections: Not on file  Intimate Partner Violence: Not on file    No past surgical history on file.  Family History  Problem Relation Age of Onset   Heart failure Mother    Hypertension Mother     No Known Allergies  Current Outpatient Medications on File Prior to Visit  Medication Sig Dispense Refill   amLODipine (NORVASC) 5 MG tablet Take 1 tablet (5 mg total) by mouth daily. 30 tablet 0   losartan-hydrochlorothiazide (HYZAAR) 100-12.5 MG tablet Take 1 tablet by mouth daily. 30 tablet 0   No current facility-administered medications on file prior to visit.    BP (!) 175/111    Pulse 98    Resp 18    Ht 5\' 2"  (1.575 m)    Wt 236 lb (107 kg)    LMP 09/20/2021    SpO2 99%  BMI 43.16 kg/m   Pt was talking on first read. She states ranting about situation last night.   150/90 second check.   Objective:   Physical Exam  General Mental Status- Alert. General Appearance- Not in acute distress.   Skin General: Color- Normal Color. Moisture- Normal Moisture.  Neck Carotid Arteries- Normal color. Moisture- Normal Moisture. No carotid bruits. No JVD.  Chest and Lung Exam Auscultation: Breath Sounds:-Normal.  Cardiovascular Auscultation:Rythm- Regular. Murmurs & Other Heart Sounds:Auscultation of the heart reveals- No Murmurs.  Abdomen Inspection:-Inspeection Normal. Palpation/Percussion:Note:No mass. Palpation and Percussion of the abdomen reveal- Non Tender, Non Distended + BS, no rebound or  guarding.   Neurologic Cranial Nerve exam:- CN III-XII intact(No nystagmus), symmetric smile. Finger to Nose:- Normal/Intact Strength:- 5/5 equal and symmetric strength both upper and lower extremities.   Skin- left side of face- one small scab and on small papule over maxillary are.Mild faint tenderness to touch.   Mouth- on inspection no obvious caries.     Assessment & Plan:   Patient Instructions  Your blood pressure was 150/90 today when I double checked.  You also I thought he remembered systolic/top number being in the 150 range as well.  Last night describe severe stressful event and was still upset when he first came to our office blood pressure was high when you were talking about the stressful event.  I would recommend that you continue losartan/HCTZ and amlodipine.  However advise increasing amlodipine to 10 mg daily.  This will give you better blood pressure control.  Check blood pressures daily over the next week.  Make sure that you are relaxed environment before checking.  If you would call and give me update on those readings.  If by chance you have any very high readings with any motor or neurologic signs and symptoms and recommend ED evaluation.  Recent frequent urination.  I do not think it is related to her diuretic as you are on low-dose.  We will get repeat metabolic panel, urinalysis and urine culture.  Left-sided facial pain over the last 2 days.  Pain could be related to tooth infection or possible shingles.  Approaching long holiday weekend and decided to go ahead and prescribe the Famvir antiviral and Augmentin antibiotic.  Would recommend that you do establish care with dentist to evaluate your teeth.  If any severe nerve type like pain then making limited number of tramadol available.  Note tramadol can cause sedation.   Esperanza Richters, PA-C

## 2021-10-01 LAB — COMPREHENSIVE METABOLIC PANEL
ALT: 27 U/L (ref 0–35)
AST: 21 U/L (ref 0–37)
Albumin: 4.5 g/dL (ref 3.5–5.2)
Alkaline Phosphatase: 49 U/L (ref 39–117)
BUN: 15 mg/dL (ref 6–23)
CO2: 26 mEq/L (ref 19–32)
Calcium: 9.4 mg/dL (ref 8.4–10.5)
Chloride: 100 mEq/L (ref 96–112)
Creatinine, Ser: 0.76 mg/dL (ref 0.40–1.20)
GFR: 96.62 mL/min (ref >60.00)
Glucose, Bld: 83 mg/dL (ref 70–99)
Potassium: 3.9 mEq/L (ref 3.5–5.1)
Sodium: 136 mEq/L (ref 135–145)
Total Bilirubin: 0.5 mg/dL (ref 0.2–1.2)
Total Protein: 8.1 g/dL (ref 6.0–8.3)

## 2021-10-02 LAB — URINE CULTURE
MICRO NUMBER:: 12790712
SPECIMEN QUALITY:: ADEQUATE

## 2021-10-05 ENCOUNTER — Telehealth: Payer: Self-pay | Admitting: Medical

## 2021-10-05 NOTE — Telephone Encounter (Signed)
Patient states she needs a OBGYN referral in order to make an appointment with the office in our building. She states she had already spoken to [facility] about it on her last OV, so she would like a referral sent. Pease advice.

## 2021-10-05 NOTE — Addendum Note (Signed)
Addended by: Gwenevere Abbot on: 10/05/2021 11:16 PM   Modules accepted: Orders

## 2021-10-06 NOTE — Telephone Encounter (Signed)
Referral has not been processed since it was just dropped yesterday , the OBGYN will call patient to schedule

## 2021-10-08 ENCOUNTER — Ambulatory Visit: Payer: No Typology Code available for payment source | Admitting: Medical

## 2021-10-17 ENCOUNTER — Other Ambulatory Visit: Payer: Self-pay | Admitting: Medical

## 2021-10-18 ENCOUNTER — Ambulatory Visit: Payer: No Typology Code available for payment source | Admitting: Medical

## 2022-07-18 ENCOUNTER — Emergency Department (HOSPITAL_BASED_OUTPATIENT_CLINIC_OR_DEPARTMENT_OTHER)
Admission: EM | Admit: 2022-07-18 | Discharge: 2022-07-18 | Disposition: A | Discharge status: Home / Self Care | Payer: No Typology Code available for payment source | Attending: Emergency Medicine | Admitting: Emergency Medicine

## 2022-07-18 ENCOUNTER — Other Ambulatory Visit: Payer: Self-pay

## 2022-07-18 ENCOUNTER — Encounter (HOSPITAL_BASED_OUTPATIENT_CLINIC_OR_DEPARTMENT_OTHER): Payer: Self-pay | Admitting: Emergency Medicine

## 2022-07-18 DIAGNOSIS — Z79899 Other long term (current) drug therapy: Secondary | ICD-10-CM | POA: Diagnosis not present

## 2022-07-18 DIAGNOSIS — Y92410 Unspecified street and highway as the place of occurrence of the external cause: Secondary | ICD-10-CM | POA: Insufficient documentation

## 2022-07-18 DIAGNOSIS — M542 Cervicalgia: Secondary | ICD-10-CM | POA: Insufficient documentation

## 2022-07-18 DIAGNOSIS — M545 Low back pain, unspecified: Secondary | ICD-10-CM | POA: Insufficient documentation

## 2022-07-18 DIAGNOSIS — R519 Headache, unspecified: Secondary | ICD-10-CM | POA: Diagnosis not present

## 2022-07-18 MED ORDER — METHOCARBAMOL 500 MG PO TABS: 500.0000 mg | ORAL_TABLET | Freq: Three times a day (TID) | ORAL | 0 refills | Status: DC | PRN

## 2022-07-18 MED ORDER — LIDOCAINE 5 % EX PTCH: 1.0000 | MEDICATED_PATCH | CUTANEOUS | 0 refills | Status: DC

## 2022-07-18 MED ORDER — NAPROXEN 375 MG PO TABS
375.0000 mg | ORAL_TABLET | Freq: Two times a day (BID) | ORAL | 0 refills | Status: DC
Start: 2022-07-18 — End: 2023-02-03
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
## 2022-11-24 ENCOUNTER — Other Ambulatory Visit (HOSPITAL_BASED_OUTPATIENT_CLINIC_OR_DEPARTMENT_OTHER): Payer: Self-pay

## 2022-11-24 ENCOUNTER — Other Ambulatory Visit: Payer: Self-pay

## 2022-11-24 ENCOUNTER — Emergency Department (HOSPITAL_BASED_OUTPATIENT_CLINIC_OR_DEPARTMENT_OTHER)
Admission: EM | Admit: 2022-11-24 | Discharge: 2022-11-24 | Disposition: A | Discharge status: Home / Self Care | Payer: Self-pay | Attending: Emergency Medicine | Admitting: Emergency Medicine

## 2022-11-24 ENCOUNTER — Emergency Department (HOSPITAL_BASED_OUTPATIENT_CLINIC_OR_DEPARTMENT_OTHER): Payer: Self-pay

## 2022-11-24 ENCOUNTER — Encounter (HOSPITAL_BASED_OUTPATIENT_CLINIC_OR_DEPARTMENT_OTHER): Payer: Self-pay

## 2022-11-24 DIAGNOSIS — R0602 Shortness of breath: Secondary | ICD-10-CM

## 2022-11-24 DIAGNOSIS — I10 Essential (primary) hypertension: Secondary | ICD-10-CM | POA: Insufficient documentation

## 2022-11-24 DIAGNOSIS — J40 Bronchitis, not specified as acute or chronic: Secondary | ICD-10-CM

## 2022-11-24 DIAGNOSIS — Z1152 Encounter for screening for COVID-19: Secondary | ICD-10-CM | POA: Insufficient documentation

## 2022-11-24 DIAGNOSIS — Z79899 Other long term (current) drug therapy: Secondary | ICD-10-CM | POA: Insufficient documentation

## 2022-11-24 DIAGNOSIS — R062 Wheezing: Secondary | ICD-10-CM

## 2022-11-24 LAB — RESP PANEL BY RT-PCR (RSV, FLU A&B, COVID)  RVPGX2
Influenza A by PCR: NEGATIVE
Influenza B by PCR: NEGATIVE
Resp Syncytial Virus by PCR: NEGATIVE
SARS Coronavirus 2 by RT PCR: NEGATIVE

## 2022-11-24 LAB — CBC WITH DIFFERENTIAL/PLATELET
Abs Immature Granulocytes: 0.01 10*3/uL (ref 0.00–0.07)
Basophils Absolute: 0 10*3/uL (ref 0.0–0.1)
Basophils Relative: 0 %
Eosinophils Absolute: 0.2 10*3/uL (ref 0.0–0.5)
Eosinophils Relative: 3 %
HCT: 45.2 % (ref 36.0–46.0)
Hemoglobin: 16.7 g/dL — ABNORMAL HIGH (ref 12.0–15.0)
Immature Granulocytes: 0 %
Lymphocytes Relative: 37 %
Lymphs Abs: 2.3 10*3/uL (ref 0.7–4.0)
MCH: 31.4 pg (ref 26.0–34.0)
MCHC: 36.9 g/dL — ABNORMAL HIGH (ref 30.0–36.0)
MCV: 85 fL (ref 80.0–100.0)
Monocytes Absolute: 0.5 10*3/uL (ref 0.1–1.0)
Monocytes Relative: 9 %
Neutro Abs: 3.2 10*3/uL (ref 1.7–7.7)
Neutrophils Relative %: 51 %
Platelets: 264 10*3/uL (ref 150–400)
RBC: 5.32 MIL/uL — ABNORMAL HIGH (ref 3.87–5.11)
RDW: 13.2 % (ref 11.5–15.5)
WBC: 6.3 10*3/uL (ref 4.0–10.5)
nRBC: 0 % (ref 0.0–0.2)

## 2022-11-24 LAB — BASIC METABOLIC PANEL
Anion gap: 13 (ref 5–15)
BUN: 10 mg/dL (ref 6–20)
CO2: 25 mmol/L (ref 22–32)
Calcium: 9.4 mg/dL (ref 8.9–10.3)
Chloride: 101 mmol/L (ref 98–111)
Creatinine, Ser: 0.79 mg/dL (ref 0.44–1.00)
GFR, Estimated: >60 mL/min (ref >60)
Glucose, Bld: 116 mg/dL — ABNORMAL HIGH (ref 70–99)
Potassium: 3.3 mmol/L — ABNORMAL LOW (ref 3.5–5.1)
Sodium: 139 mmol/L (ref 135–145)

## 2022-11-24 LAB — TROPONIN I (HIGH SENSITIVITY): Troponin I (High Sensitivity): 5 ng/L (ref <18)

## 2022-11-24 MED ORDER — POTASSIUM CHLORIDE CRYS ER 20 MEQ PO TBCR
40.0000 meq | EXTENDED_RELEASE_TABLET | Freq: Once | ORAL | Status: AC
  Administered 2022-11-24: 40 meq via ORAL
  Filled 2022-11-24: qty 2

## 2022-11-24 MED ORDER — AMLODIPINE BESYLATE 10 MG PO TABS
10.0000 mg | ORAL_TABLET | Freq: Every day | ORAL | 0 refills | Status: DC
  Filled 2022-11-24: qty 90, 90d supply, fill #0

## 2022-11-24 MED ORDER — ALBUTEROL SULFATE (2.5 MG/3ML) 0.083% IN NEBU
2.5000 mg | INHALATION_SOLUTION | Freq: Once | RESPIRATORY_TRACT | Status: AC
  Administered 2022-11-24: 2.5 mg via RESPIRATORY_TRACT
  Filled 2022-11-24: qty 3

## 2022-11-24 MED ORDER — SODIUM CHLORIDE 0.9 % IV BOLUS (SEPSIS)
1000.0000 mL | Freq: Once | INTRAVENOUS | Status: AC
Start: 2022-11-24 — End: 2022-11-24
  Administered 2022-11-24: 1000 mL via INTRAVENOUS

## 2022-11-24 MED ORDER — AMLODIPINE BESYLATE 5 MG PO TABS
10.0000 mg | ORAL_TABLET | Freq: Once | ORAL | Status: AC
  Administered 2022-11-24: 10 mg via ORAL
  Filled 2022-11-24: qty 2

## 2022-11-24 MED ORDER — ALBUTEROL SULFATE HFA 108 (90 BASE) MCG/ACT IN AERS
2.0000 | INHALATION_SPRAY | RESPIRATORY_TRACT | Status: DC | PRN
  Administered 2022-11-24: 2 via RESPIRATORY_TRACT
  Filled 2022-11-24: qty 6.7

## 2022-11-24 MED ORDER — IPRATROPIUM-ALBUTEROL 0.5-2.5 (3) MG/3ML IN SOLN
3.0000 mL | Freq: Once | RESPIRATORY_TRACT | Status: AC
  Administered 2022-11-24: 3 mL via RESPIRATORY_TRACT
  Filled 2022-11-24: qty 3

## 2022-11-24 MED ORDER — METHYLPREDNISOLONE SODIUM SUCC 125 MG IJ SOLR
125.0000 mg | Freq: Once | INTRAMUSCULAR | Status: AC
  Administered 2022-11-24: 125 mg via INTRAVENOUS
  Filled 2022-11-24: qty 2

## 2022-11-24 MED ORDER — PREDNISONE 20 MG PO TABS
40.0000 mg | ORAL_TABLET | Freq: Every day | ORAL | 0 refills | Status: AC
  Filled 2022-11-24: qty 8, 4d supply, fill #0

## 2022-11-24 NOTE — ED Notes (Signed)
Ambulated from lobby to room 8, audible wheezes with no hx of asthma, accessory muscle use with a congested cough.

## 2022-11-24 NOTE — Discharge Instructions (Addendum)
You were seen in the emergency department today for wheezing and bronchitis. You were given breathing treatments and your first dose of steroids here. You were prescribed additional steroids (prednisone) to take during the following 4 days to help your symptoms improve. Please take this as directed.  Continue to use your albuterol inhaler 2-4 puffs every 4-6 hours as needed and continue to take any other asthma medication that you use.   Make sure to take your blood pressure medications as prescribed and do not miss any doses.  Please make an appointment with your primary care doctor within one week to assure improvement or resolution in symptoms as well as reevaluation and discussion of your high blood pressure.  f your breathing worsens and does not improve with your inhaler, or you need to use your inhaler more than 2 times every 4 hours, you develop severe chest pain, fainting, or any other symptoms concerning to you, you should return to the emergency department.

## 2022-11-24 NOTE — ED Notes (Signed)
PT verbally understood discharge instructions. Topaz signature pad not working

## 2022-11-24 NOTE — ED Notes (Addendum)
   11/24/22 0800  Resting  Supplemental oxygen during test? No  Resting Heart Rate 100  Resting Sp02 96  Lap 1 (250 feet)  HR 110  02 Sat 93

## 2022-11-24 NOTE — ED Provider Notes (Signed)
[facility] Provider Note   CSN: [id number] Arrival date & time: 11/24/22  [id number]     History  Chief Complaint  Patient presents with   Shortness of Breath    April Watkins is a 42 y.o. female.  Smoker with PMH of HTN who presents with wheezing and difficulty breathing.  Patient complaining of cough over the past week productive of brown sputum with wheezing and associated chest tightness with.  Feeling difficulty breathing worse with exertion.  Has been having no fevers, no sore throat, no leg pain or swelling.  No history of reactive airway disease or asthma.  No history of PE or DVT.  No orthopnea or PND.  She smokes marijuana often and daily.  She was given albuterol treatment on arrival and felt significantly better and denies any active chest pain currently. She has not taken her blood pressure medications.    Shortness of Breath      Home Medications Prior to Admission medications   Medication Sig Start Date End Date Taking? Authorizing Provider  predniSONE (DELTASONE) 20 MG tablet Take 2 tablets (40 mg total) by mouth daily for 4 days. 11/25/22 11/29/22 Yes Elgie Congo, MD  amLODipine (NORVASC) 10 MG tablet Take 1 tablet (10 mg total) by mouth daily. 11/24/22   Elgie Congo, MD  amoxicillin-clavulanate (AUGMENTIN) 875-125 MG tablet Take 1 tablet by mouth 2 (two) times daily. 09/30/21   Saguier, Percell Miller, PA-C  famciclovir (FAMVIR) 500 MG tablet Take 1 tablet (500 mg total) by mouth 3 (three) times daily. 09/30/21   Saguier, Percell Miller, PA-C  lidocaine (LIDODERM) 5 % Place 1 patch onto the skin daily. Remove & Discard patch within 12 hours or as directed by MD 07/18/22   Theressa Stamps R, PA  losartan-hydrochlorothiazide (HYZAAR) 100-12.5 MG tablet Take 1 tablet by mouth daily. 09/24/21   Saguier, Percell Miller, PA-C  methocarbamol (ROBAXIN) 500 MG tablet Take 1 tablet (500 mg total) by mouth every 8 (eight) hours as needed for muscle  spasms. 07/18/22   Clark, Meghan R, PA  naproxen (NAPROSYN) 375 MG tablet Take 1 tablet (375 mg total) by mouth 2 (two) times daily. 07/18/22   Theressa Stamps R, PA      Allergies    Patient has no known allergies.    Review of Systems   Review of Systems  Respiratory:  Positive for shortness of breath.     Physical Exam Updated Vital Signs BP (!) 177/117   Pulse (!) 101   Temp 98.3 F (36.8 C) (Oral)   Resp (!) 22   Ht 5' 1"$  (1.549 m)   Wt 104.3 kg   LMP 11/23/2022 (Exact Date)   SpO2 95%   BMI 43.46 kg/m  Physical Exam Constitutional: Alert and oriented. Nontoxic, able to speak in full sentences Eyes: Conjunctivae are normal. ENT      Head: Normocephalic and atraumatic.      Nose: No congestion.      Mouth/Throat: Mucous membranes are moist.      Neck: No stridor. No tripoding Cardiovascular: S1, S2, mildly tachycardic, warm well-perfused Respiratory: Mildly tachypneic, diffuse end expiratory wheezing worse in the upper lung fields, good air movement throughout, O2 sat 94-95 at rest and on ambulation, mild intercostal retractions Gastrointestinal: Soft  Musculoskeletal: Normal range of motion in all extremities.      Right lower leg: No tenderness or edema.      Left lower leg: No tenderness or edema. Neurologic: Normal  speech and language.  No facial droop.  Moving all extremities equally.  Sensation grossly intact.  No gross focal neurologic deficits are appreciated. Skin: Skin is warm, dry and intact. No rash noted. Psychiatric: Mood and affect are normal. Speech and behavior are normal.  ED Results / Procedures / Treatments   Labs (all labs ordered are listed, but only abnormal results are displayed) Labs Reviewed  BASIC METABOLIC PANEL - Abnormal; Notable for the following components:      Result Value   Potassium 3.3 (*)    Glucose, Bld 116 (*)    All other components within normal limits  CBC WITH DIFFERENTIAL/PLATELET - Abnormal; Notable for the following  components:   RBC 5.32 (*)    Hemoglobin 16.7 (*)    MCHC 36.9 (*)    All other components within normal limits  RESP PANEL BY RT-PCR (RSV, FLU A&B, COVID)  RVPGX2  TROPONIN I (HIGH SENSITIVITY)  TROPONIN I (HIGH SENSITIVITY)    EKG EKG Interpretation  Date/Time:  Thursday November 24 2022 08:33:36 EST Ventricular Rate:  93 PR Interval:  161 QRS Duration: 74 QT Interval:  375 QTC Calculation: 467 R Axis:   16 Text Interpretation: Sinus rhythm Low voltage, precordial leads Borderline T wave abnormalities No previous ECGs available Confirmed by Georgina Snell [phone]) on 11/24/2022 10:10:40 AM  Radiology DG Chest Portable 1 View  Result Date: 11/24/2022 CLINICAL DATA:  Cough, shortness of breath. EXAM: PORTABLE CHEST 1 VIEW COMPARISON:  September 24, 2021. FINDINGS: The heart size and mediastinal contours are within normal limits. Both lungs are clear. The visualized skeletal structures are unremarkable. IMPRESSION: No active disease. Electronically Signed   By: Marijo Conception M.D.   On: 11/24/2022 09:10    Procedures Procedures  Remain on constant cardiac monitoring, sinus tachycardia  Medications Ordered in ED Medications  ipratropium-albuterol (DUONEB) 0.5-2.5 (3) MG/3ML nebulizer solution 3 mL (3 mLs Nebulization Given 11/24/22 0837)  albuterol (PROVENTIL) (2.5 MG/3ML) 0.083% nebulizer solution 2.5 mg (2.5 mg Nebulization Given 11/24/22 0837)  albuterol (PROVENTIL) (2.5 MG/3ML) 0.083% nebulizer solution 2.5 mg (2.5 mg Nebulization Given 11/24/22 0931)  ipratropium-albuterol (DUONEB) 0.5-2.5 (3) MG/3ML nebulizer solution 3 mL (3 mLs Nebulization Given 11/24/22 0931)  amLODipine (NORVASC) tablet 10 mg (10 mg Oral Given 11/24/22 0938)  methylPREDNISolone sodium succinate (SOLU-MEDROL) 125 mg/2 mL injection 125 mg (125 mg Intravenous Given 11/24/22 0938)  sodium chloride 0.9 % bolus 1,000 mL (0 mLs Intravenous Stopped 11/24/22 1133)  potassium chloride SA (KLOR-CON M) CR tablet 40 mEq  (40 mEq Oral Given 11/24/22 1126)    ED Course/ Medical Decision Making/ A&P Clinical Course as of 11/24/22 1741  Thu Nov 24, 2022  1119 Reassessed patient, significantly improved wheezing and aeration throughout all lung spaces.  Work of breathing has significantly improved.  She is satting 94% at rest and on ambulation.  Heart rate in the 90s.  Remains hypertensive however asymptomatic.  No signs of hypertensive emergency on workup today.  Discussed close follow-up with PCP and strict return precautions, represcribed patient's amlodipine which she has been noncompliant with and 4 days of prednisone as well as take-home albuterol inhaler.  Advise close follow-up and strict return precautions.  She is in agreement with this plan. [VB]    Clinical Course User Index [VB] Elgie Congo, MD   {                            Medical  Decision Making Taber Sankey is a 42 y.o. female.  Smoker with PMH of HTN who presents with wheezing and difficulty breathing.   Patient having wheezing on exam with mild increased work of breathing concerning for reactive airway disease exacerbation in the setting of possible viral URI versus bacterial pneumonia versus environmental exposure among multiple other etiologies.  There is no associated rash, hypotension, vomiting or other symptoms on exam concerning for anaphylaxis or allergic disease.  Patient given DuoNebs and IV Solu-Medrol as well as home amlodipine for HTN.  Chest x-ray obtained reviewed by me no evidence of pneumonia, no pneumothorax, no pulmonary edema.  COVID RSV flu negative.  Troponin 5 and unremarkable without chest pain.  EKG with nonspecific changes, no acute ischemia noted.  Creatinine 0.79.  Of note, during patient's stay noted to be hypertensive however noncompliant with home medications and asymptomatic.  There were no findings on exam today concerning for hypertensive emergency.  Reassessed patient after she receiving DuoNebs and  Solu-Medrol as well as home amlodipine, significantly improved wheezing and aeration throughout all lung spaces.  Work of breathing has significantly improved.  She is satting 94% at rest and on ambulation.  Heart rate in the 90s.  Remains hypertensive however asymptomatic.  No signs of hypertensive emergency on workup today.  Discussed close follow-up with PCP and strict return precautions, represcribed patient's amlodipine which she has been noncompliant with and 4 days of prednisone as well as take-home albuterol inhaler.  Advise close follow-up and strict return precautions.  She is in agreement with this plan. [VB]    Amount and/or Complexity of Data Reviewed Labs: ordered. Radiology: ordered.  Risk Prescription drug management.    Final Clinical Impression(s) / ED Diagnoses Final diagnoses:  Shortness of breath  Bronchitis  Wheezing    Rx / DC Orders ED Discharge Orders          Ordered    predniSONE (DELTASONE) 20 MG tablet  Daily        11/24/22 1117    amLODipine (NORVASC) 10 MG tablet  Daily        11/24/22 1117              Elgie Congo, MD 11/24/22 [phone]

## 2022-11-24 NOTE — ED Triage Notes (Signed)
C/o productive cough/congestion/shortness of breath x several days. Audible wheezing heard during triage, pt states "I can't breathe". SpO2 maintained 93-96% on RA upon ambulation to room.

## 2022-11-28 ENCOUNTER — Encounter: Payer: Self-pay | Admitting: General Practice

## 2022-11-29 ENCOUNTER — Emergency Department (HOSPITAL_BASED_OUTPATIENT_CLINIC_OR_DEPARTMENT_OTHER): Payer: Self-pay

## 2022-11-29 ENCOUNTER — Other Ambulatory Visit: Payer: Self-pay

## 2022-11-29 ENCOUNTER — Emergency Department (HOSPITAL_BASED_OUTPATIENT_CLINIC_OR_DEPARTMENT_OTHER)
Admission: EM | Admit: 2022-11-29 | Discharge: 2022-11-29 | Disposition: A | Discharge status: Home / Self Care | Payer: Self-pay | Attending: Emergency Medicine | Admitting: Emergency Medicine

## 2022-11-29 ENCOUNTER — Encounter (HOSPITAL_BASED_OUTPATIENT_CLINIC_OR_DEPARTMENT_OTHER): Payer: Self-pay

## 2022-11-29 DIAGNOSIS — Z1152 Encounter for screening for COVID-19: Secondary | ICD-10-CM | POA: Insufficient documentation

## 2022-11-29 DIAGNOSIS — R0602 Shortness of breath: Secondary | ICD-10-CM | POA: Insufficient documentation

## 2022-11-29 DIAGNOSIS — D72829 Elevated white blood cell count, unspecified: Secondary | ICD-10-CM | POA: Insufficient documentation

## 2022-11-29 DIAGNOSIS — E876 Hypokalemia: Secondary | ICD-10-CM | POA: Insufficient documentation

## 2022-11-29 LAB — CBC WITH DIFFERENTIAL/PLATELET
Abs Immature Granulocytes: 0.05 10*3/uL (ref 0.00–0.07)
Basophils Absolute: 0.1 10*3/uL (ref 0.0–0.1)
Basophils Relative: 0 %
Eosinophils Absolute: 0.1 10*3/uL (ref 0.0–0.5)
Eosinophils Relative: 1 %
HCT: 40.2 % (ref 36.0–46.0)
Hemoglobin: 14.8 g/dL (ref 12.0–15.0)
Immature Granulocytes: 0 %
Lymphocytes Relative: 43 %
Lymphs Abs: 5.7 10*3/uL — ABNORMAL HIGH (ref 0.7–4.0)
MCH: 31.1 pg (ref 26.0–34.0)
MCHC: 36.8 g/dL — ABNORMAL HIGH (ref 30.0–36.0)
MCV: 84.5 fL (ref 80.0–100.0)
Monocytes Absolute: 0.8 10*3/uL (ref 0.1–1.0)
Monocytes Relative: 6 %
Neutro Abs: 6.5 10*3/uL (ref 1.7–7.7)
Neutrophils Relative %: 50 %
Platelets: 341 10*3/uL (ref 150–400)
RBC: 4.76 MIL/uL (ref 3.87–5.11)
RDW: 13.3 % (ref 11.5–15.5)
Smear Review: NORMAL
WBC: 13.3 10*3/uL — ABNORMAL HIGH (ref 4.0–10.5)
nRBC: 0 % (ref 0.0–0.2)

## 2022-11-29 LAB — RESP PANEL BY RT-PCR (RSV, FLU A&B, COVID)  RVPGX2
Influenza A by PCR: NEGATIVE
Influenza B by PCR: NEGATIVE
Resp Syncytial Virus by PCR: NEGATIVE
SARS Coronavirus 2 by RT PCR: NEGATIVE

## 2022-11-29 LAB — COMPREHENSIVE METABOLIC PANEL
ALT: 29 U/L (ref 0–44)
AST: 28 U/L (ref 15–41)
Albumin: 3.9 g/dL (ref 3.5–5.0)
Alkaline Phosphatase: 57 U/L (ref 38–126)
Anion gap: 8 (ref 5–15)
BUN: 14 mg/dL (ref 6–20)
CO2: 27 mmol/L (ref 22–32)
Calcium: 8.5 mg/dL — ABNORMAL LOW (ref 8.9–10.3)
Chloride: 98 mmol/L (ref 98–111)
Creatinine, Ser: 0.72 mg/dL (ref 0.44–1.00)
GFR, Estimated: >60 mL/min (ref >60)
Glucose, Bld: 85 mg/dL (ref 70–99)
Potassium: 3.1 mmol/L — ABNORMAL LOW (ref 3.5–5.1)
Sodium: 133 mmol/L — ABNORMAL LOW (ref 135–145)
Total Bilirubin: 0.6 mg/dL (ref 0.3–1.2)
Total Protein: 8.1 g/dL (ref 6.5–8.1)

## 2022-11-29 MED ORDER — POTASSIUM CHLORIDE CRYS ER 20 MEQ PO TBCR: 40.0000 meq | EXTENDED_RELEASE_TABLET | Freq: Once | ORAL | Status: DC

## 2022-11-29 MED ORDER — IPRATROPIUM-ALBUTEROL 0.5-2.5 (3) MG/3ML IN SOLN
3.0000 mL | Freq: Once | RESPIRATORY_TRACT | Status: AC
  Administered 2022-11-29: 3 mL via RESPIRATORY_TRACT
  Filled 2022-11-29: qty 3

## 2022-11-29 MED ORDER — BENZONATATE 100 MG PO CAPS
200.0000 mg | ORAL_CAPSULE | Freq: Once | ORAL | Status: AC
  Administered 2022-11-29: 200 mg via ORAL
  Filled 2022-11-29: qty 2

## 2022-11-29 MED ORDER — DEXTROMETHORPHAN HBR 15 MG/5ML PO SYRP: 10.0000 mL | ORAL_SOLUTION | Freq: Four times a day (QID) | ORAL | 0 refills | Status: DC | PRN

## 2022-11-29 NOTE — ED Provider Notes (Signed)
[facility] Provider Note   CSN: [id number] Arrival date & time: 11/29/22  1442     History Chief Complaint  Patient presents with   Shortness of Breath    April Watkins is a 42 y.o. female.  Patient presented to the emergency department complaints of shortness of breath.  Patient was seen in the emergency department a few days ago for similar complaints.  Since patient was last seen in the emergency department she has been on a short course of steroids to help alleviate her symptoms.  She reports that she had some improvement in symptoms but is frustrated that the cough is significantly.  Patient has not been taking a cough suppressant medication since the onset of her symptoms.  Patient denies any chest pain, dizziness, lightheadedness, pleuritic chest pain.  Patient not currently on any oral contraceptive, no prior history of DVT or PE.  Not on blood thinners.   Shortness of Breath Associated symptoms: cough   Associated symptoms: no abdominal pain, no chest pain, no fever and no vomiting        Home Medications Prior to Admission medications   Medication Sig Start Date End Date Taking? Authorizing Provider  dextromethorphan 15 MG/5ML syrup Take 10 mLs (30 mg total) by mouth 4 (four) times daily as needed for cough. 11/29/22  Yes Lourdes Sledge A, PA-C  amLODipine (NORVASC) 10 MG tablet Take 1 tablet (10 mg total) by mouth daily. 11/24/22   Elgie Congo, MD  amoxicillin-clavulanate (AUGMENTIN) 875-125 MG tablet Take 1 tablet by mouth 2 (two) times daily. 09/30/21   Saguier, Percell Miller, PA-C  famciclovir (FAMVIR) 500 MG tablet Take 1 tablet (500 mg total) by mouth 3 (three) times daily. 09/30/21   Saguier, Percell Miller, PA-C  lidocaine (LIDODERM) 5 % Place 1 patch onto the skin daily. Remove & Discard patch within 12 hours or as directed by MD 07/18/22   Theressa Stamps R, PA  losartan-hydrochlorothiazide (HYZAAR) 100-12.5 MG tablet Take 1  tablet by mouth daily. 09/24/21   Saguier, Percell Miller, PA-C  methocarbamol (ROBAXIN) 500 MG tablet Take 1 tablet (500 mg total) by mouth every 8 (eight) hours as needed for muscle spasms. 07/18/22   Clark, Meghan R, PA  naproxen (NAPROSYN) 375 MG tablet Take 1 tablet (375 mg total) by mouth 2 (two) times daily. 07/18/22   Clark, Meghan R, PA  predniSONE (DELTASONE) 20 MG tablet Take 2 tablets (40 mg total) by mouth daily for 4 days. 11/25/22 11/29/22  Elgie Congo, MD      Allergies    Patient has no known allergies.    Review of Systems   Review of Systems  Constitutional:  Negative for fever.  Respiratory:  Positive for cough and shortness of breath. Negative for chest tightness.   Cardiovascular:  Negative for chest pain.  Gastrointestinal:  Negative for abdominal pain, diarrhea, nausea and vomiting.  All other systems reviewed and are negative.   Physical Exam Updated Vital Signs BP (!) 141/89   Pulse 92   Temp 98 F (36.7 C)   Resp 19   Ht 5' 1"$  (1.549 m)   Wt 104.3 kg   LMP 11/23/2022 (Exact Date)   SpO2 92%   BMI 43.46 kg/m  Physical Exam Vitals and nursing note reviewed.  Constitutional:      Appearance: She is well-developed.  HENT:     Head: Normocephalic and atraumatic.  Eyes:     Extraocular Movements: Extraocular movements intact.  Cardiovascular:     Rate and Rhythm: Regular rhythm. Tachycardia present.     Comments: On reassessment, patient no longer tachycardic and is in normal sinus rhythm Pulmonary:     Effort: Pulmonary effort is normal.     Breath sounds: Wheezing present. No decreased breath sounds or rhonchi.     Comments: Wheezing improved on reassessment after patient coughs and after administration of DuoNeb Abdominal:     Palpations: Abdomen is soft.  Skin:    General: Skin is warm.     Capillary Refill: Capillary refill takes less than 2 seconds.  Neurological:     Mental Status: She is alert.     ED Results / Procedures / Treatments    Labs (all labs ordered are listed, but only abnormal results are displayed) Labs Reviewed  CBC WITH DIFFERENTIAL/PLATELET - Abnormal; Notable for the following components:      Result Value   WBC 13.3 (*)    MCHC 36.8 (*)    All other components within normal limits  COMPREHENSIVE METABOLIC PANEL - Abnormal; Notable for the following components:   Sodium 133 (*)    Potassium 3.1 (*)    Calcium 8.5 (*)    All other components within normal limits  RESP PANEL BY RT-PCR (RSV, FLU A&B, COVID)  [id number]    EKG EKG Interpretation  Date/Time:  Tuesday November 29 2022 16:18:09 EST Ventricular Rate:  92 PR Interval:  162 QRS Duration: 72 QT Interval:  357 QTC Calculation: 442 R Axis:   19 Text Interpretation: Sinus rhythm Borderline T wave abnormalities Confirmed by Regan Lemming ([id number]) on 11/29/2022 4:28:15 PM  Radiology DG Chest 2 View  Result Date: 11/29/2022 CLINICAL DATA:  Shortness of breath. EXAM: CHEST - 2 VIEW COMPARISON:  November 24, 2022 FINDINGS: The heart size and mediastinal contours are within normal limits. There is no evidence of focal consolidation, pleural effusion or pneumothorax. Breast attenuation artifact is seen overlying the bilateral lung bases. Chronic appearing osseous changes are again seen overlying the medial aspect of the upper right lung on the frontal view. The visualized skeletal structures are unremarkable. IMPRESSION: No active cardiopulmonary disease. Electronically Signed   By: Virgina Norfolk M.D.   On: 11/29/2022 17:29    Procedures Procedures   Medications Ordered in ED Medications  potassium chloride SA (KLOR-CON M) CR tablet 40 mEq (has no administration in time range)  ipratropium-albuterol (DUONEB) 0.5-2.5 (3) MG/3ML nebulizer solution 3 mL (3 mLs Nebulization Given 11/29/22 1701)  benzonatate (TESSALON) capsule 200 mg (200 mg Oral Given 11/29/22 1725)    ED Course/ Medical Decision Making/ A&P                           Medical  Decision Making Amount and/or Complexity of Data Reviewed Labs: ordered. Radiology: ordered.  Risk Prescription drug management.   This patient presents to the ED for concern of shortness of breath.  Differential diagnosis includes viral URI, pneumonia, pulmonary malaise him, pneumothorax   Lab Tests:  I Ordered, and personally interpreted labs.  The pertinent results include: Mild hypokalemia 3.1, leukocytosis of 13.3 but likely secondary to recent steroid use, negative respiratory viral panel   Imaging Studies ordered:  I ordered imaging studies including chest x-ray I independently visualized and interpreted imaging which showed no acute cardiopulmonary disease process I agree with the radiologist interpretation   Medicines ordered and prescription drug management:  I ordered medication including DuoNeb, Tessalon, Klor-Con for  shortness of breath, cough, hypokalemia Reevaluation of the patient after these medicines showed that the patient improved I have reviewed the patients home medicines and have made adjustments as needed   Problem List / ED Course:  Patient presents emergency department with complaints of shortness of breath. Patient seen in ED a few days ago for similar complaints. She reports that she is slowly getting better, but concerned by persistent cough which has remained productive. Denies fevers, chest pain, confusion, headaches, or abdominal discomfort. Patient has been taking steroid burst that was prescribed during last ED visit and today is last day of doses. Patient had some notable wheezing on initial auscultation, but this resolved after patient coughed. After administration of DuoNeb, wheezing significantly improved. Given that patient's breathing improves with coughing, more concerned that this is respiratory in nature such as possible bronchitis or pneumonia. Chest xray reassuring without evidence of acute cardiopulmonary disease. Advised patient of  findings and encouraged patient to use anti-tussive medications to help alleviate discomfort from coughing she has been experiencing. Patient agreeable to treatment plan and verbalized understanding return precautions. All questions answered prior to patient discharge.   Final Clinical Impression(s) / ED Diagnoses Final diagnoses:  Shortness of breath  Hypokalemia    Rx / DC Orders ED Discharge Orders          Ordered    dextromethorphan 15 MG/5ML syrup  4 times daily PRN        11/29/22 1806              Vladimir Creeks 11/29/22 1836    Regan Lemming, MD 11/29/22 2320

## 2022-11-29 NOTE — ED Triage Notes (Signed)
Pt c/o "wheezing" and shortness of breath that started 2 weeks ago.

## 2022-11-29 NOTE — ED Notes (Signed)
Discharge paperwork reviewed entirely with patient, including Rx's and follow up care. Pain was under control. Pt verbalized understanding as well as all parties involved. No questions or concerns voiced at the time of discharge. No acute distress noted.   Pt ambulated out to PVA without incident or assistance.  

## 2022-11-29 NOTE — ED Triage Notes (Signed)
Pt reports she was swabbed for Covid, had chest x-ray and blood work 4 days ago.

## 2022-11-29 NOTE — Discharge Instructions (Addendum)
You were seen in the ER for shortness of breath. All of your labs were reassuring at this time without a specific cause of shortness of breath seen. Given your cough, it appears that this is likely related to the extent of the cough you are having. At this time, antibiotics are not indicated but if you begin to develop a fever, please return to the ER or follow up with primary care provider for further evaluation.

## 2022-12-02 ENCOUNTER — Ambulatory Visit: Payer: Self-pay | Admitting: Medical

## 2022-12-06 ENCOUNTER — Ambulatory Visit: Payer: Self-pay | Admitting: Medical

## 2023-01-19 ENCOUNTER — Other Ambulatory Visit (HOSPITAL_COMMUNITY)
Admission: RE | Admit: 2023-01-19 | Discharge: 2023-01-19 | Disposition: A | Discharge status: Home / Self Care | Payer: BC Managed Care – PPO | Source: Ambulatory Visit | Attending: Family Medicine | Admitting: Family Medicine

## 2023-01-19 ENCOUNTER — Encounter: Payer: Self-pay | Admitting: Family Medicine

## 2023-01-19 ENCOUNTER — Ambulatory Visit: Payer: BC Managed Care – PPO | Admitting: Family Medicine

## 2023-01-19 VITALS — BP 163/109 | HR 84 | Ht 62.0 in | Wt 237.0 lb

## 2023-01-19 DIAGNOSIS — Z01419 Encounter for gynecological examination (general) (routine) without abnormal findings: Secondary | ICD-10-CM | POA: Insufficient documentation

## 2023-01-19 DIAGNOSIS — Z1339 Encounter for screening examination for other mental health and behavioral disorders: Secondary | ICD-10-CM

## 2023-01-19 DIAGNOSIS — Z1231 Encounter for screening mammogram for malignant neoplasm of breast: Secondary | ICD-10-CM

## 2023-01-19 NOTE — Progress Notes (Signed)
ANNUAL EXAM Patient name: April Watkins MRN [id number]  Date of birth: September 15, 1981 Chief Complaint:   Annual Exam  History of Present Illness:   April Watkins is a 42 y.o.  G1P0  female  being seen today for a routine annual exam.  Current complaints: regular menses. Some mild nightsweats and hot flashes. Same gender relationship.  Patient's last menstrual period was 01/15/2023 (exact date).    Last pap uncertain. H/O abnormal pap: no Last mammogram: due      01/19/2023    2:30 PM 09/24/2021   11:20 AM  Depression screen PHQ 2/9  Decreased Interest 1 0  Down, Depressed, Hopeless 1 0  PHQ - 2 Score 2 0  Altered sleeping 2   Tired, decreased energy 2   Change in appetite 3   Feeling bad or failure about yourself  1   Trouble concentrating 0   Moving slowly or fidgety/restless 0   Suicidal thoughts 0   PHQ-9 Score 10         01/19/2023    2:30 PM  GAD 7 : Generalized Anxiety Score  Nervous, Anxious, on Edge 1  Control/stop worrying 2  Worry too much - different things 2  Trouble relaxing 1  Restless 0  Easily annoyed or irritable 1  Afraid - awful might happen 2  Total GAD 7 Score 9     Review of Systems:   Pertinent items are noted in HPI Denies any headaches, blurred vision, fatigue, shortness of breath, chest pain, abdominal pain, abnormal vaginal discharge/itching/odor/irritation, problems with periods, bowel movements, urination, or intercourse unless otherwise stated above. Pertinent History Reviewed:  Reviewed past medical,surgical, social and family history.  Reviewed problem list, medications and allergies. Physical Assessment:   Vitals:   01/19/23 1423 01/19/23 1436  BP: (!) 174/112 (!) 163/109  Pulse: 83 84  Weight: 237 lb (107.5 kg)   Height: 5\' 2"  (1.575 m)   Body mass index is 43.35 kg/m.        Physical Examination:   General appearance - well appearing, and in no distress  Mental status - alert, oriented to person, place, and  time  Psych:  She has a normal mood and affect  Skin - warm and dry, normal color, no suspicious lesions noted  Chest - effort normal, all lung fields clear to auscultation bilaterally  Heart - normal rate and regular rhythm  Neck:  midline trachea, no thyromegaly or nodules  Breasts - breasts appear normal, no suspicious masses, no skin or nipple changes or axillary nodes  Abdomen - soft, nontender, nondistended, no masses or organomegaly  Pelvic - VULVA: normal appearing vulva with no masses, tenderness or lesions  VAGINA: normal appearing vagina with normal color and discharge, no lesions  CERVIX: normal appearing cervix without discharge or lesions, no CMT  Thin prep pap is done with HR HPV cotesting  UTERUS: uterus is felt to be normal size, shape, consistency and nontender   ADNEXA: No adnexal masses or tenderness noted.  Extremities:  No swelling or varicosities noted  Chaperone present for exam  Assessment & Plan:  1. Well woman exam with routine gynecological exam - Cytology - PAP( [facility])  2. Breast cancer screening by mammogram - MM 3D SCREENING MAMMOGRAM BILATERAL BREAST; Future   Labs/procedures today:   Orders Placed This Encounter  Procedures   MM 3D SCREENING MAMMOGRAM BILATERAL BREAST    Meds: No orders of the defined types were placed in this encounter.   Follow-up:  No follow-ups on file.  Levie Heritage, DO 01/19/2023 5:20 PM

## 2023-01-25 LAB — CYTOLOGY - PAP
Chlamydia: NEGATIVE
Comment: NEGATIVE
Comment: NEGATIVE
Comment: NEGATIVE
Comment: NORMAL
Diagnosis: NEGATIVE
Diagnosis: REACTIVE
High risk HPV: NEGATIVE
Neisseria Gonorrhea: NEGATIVE
Trichomonas: NEGATIVE

## 2023-01-26 ENCOUNTER — Telehealth (HOSPITAL_BASED_OUTPATIENT_CLINIC_OR_DEPARTMENT_OTHER): Payer: Self-pay

## 2023-02-03 ENCOUNTER — Ambulatory Visit: Payer: BC Managed Care – PPO | Admitting: Medical

## 2023-02-03 ENCOUNTER — Other Ambulatory Visit: Payer: Self-pay | Admitting: Medical

## 2023-02-03 ENCOUNTER — Ambulatory Visit (HOSPITAL_BASED_OUTPATIENT_CLINIC_OR_DEPARTMENT_OTHER)
Admission: RE | Admit: 2023-02-03 | Discharge: 2023-02-03 | Disposition: A | Discharge status: Home / Self Care | Payer: BC Managed Care – PPO | Source: Ambulatory Visit | Attending: Medical | Admitting: Medical

## 2023-02-03 VITALS — BP 158/98 | HR 90 | Resp 18 | Ht 62.0 in | Wt 240.0 lb

## 2023-02-03 DIAGNOSIS — R002 Palpitations: Secondary | ICD-10-CM | POA: Diagnosis not present

## 2023-02-03 DIAGNOSIS — R0600 Dyspnea, unspecified: Secondary | ICD-10-CM | POA: Diagnosis not present

## 2023-02-03 DIAGNOSIS — R1010 Upper abdominal pain, unspecified: Secondary | ICD-10-CM

## 2023-02-03 DIAGNOSIS — M79609 Pain in unspecified limb: Secondary | ICD-10-CM

## 2023-02-03 DIAGNOSIS — R600 Localized edema: Secondary | ICD-10-CM

## 2023-02-03 DIAGNOSIS — K0889 Other specified disorders of teeth and supporting structures: Secondary | ICD-10-CM | POA: Diagnosis not present

## 2023-02-03 DIAGNOSIS — I10 Essential (primary) hypertension: Secondary | ICD-10-CM

## 2023-02-03 DIAGNOSIS — R0683 Snoring: Secondary | ICD-10-CM

## 2023-02-03 LAB — CBC WITH DIFFERENTIAL/PLATELET
Basophils Absolute: 0 10*3/uL (ref 0.0–0.1)
Basophils Relative: 0.4 % (ref 0.0–3.0)
Eosinophils Absolute: 0 10*3/uL (ref 0.0–0.7)
Eosinophils Relative: 0.5 % (ref 0.0–5.0)
HCT: 41.7 % (ref 36.0–46.0)
Hemoglobin: 14.5 g/dL (ref 12.0–15.0)
Lymphocytes Relative: 37.4 % (ref 12.0–46.0)
Lymphs Abs: 3 10*3/uL (ref 0.7–4.0)
MCHC: 34.9 g/dL (ref 30.0–36.0)
MCV: 90.6 fl (ref 78.0–100.0)
Monocytes Absolute: 0.6 10*3/uL (ref 0.1–1.0)
Monocytes Relative: 7 % (ref 3.0–12.0)
Neutro Abs: 4.4 10*3/uL (ref 1.4–7.7)
Neutrophils Relative %: 54.7 % (ref 43.0–77.0)
Platelets: 236 10*3/uL (ref 150.0–400.0)
RBC: 4.6 Mil/uL (ref 3.87–5.11)
RDW: 14.3 % (ref 11.5–15.5)
WBC: 8.1 10*3/uL (ref 4.0–10.5)

## 2023-02-03 LAB — COMPREHENSIVE METABOLIC PANEL
ALT: 27 U/L (ref 0–35)
AST: 24 U/L (ref 0–37)
Albumin: 4.2 g/dL (ref 3.5–5.2)
Alkaline Phosphatase: 49 U/L (ref 39–117)
BUN: 11 mg/dL (ref 6–23)
CO2: 29 mEq/L (ref 19–32)
Calcium: 9.2 mg/dL (ref 8.4–10.5)
Chloride: 99 mEq/L (ref 96–112)
Creatinine, Ser: 0.71 mg/dL (ref 0.40–1.20)
GFR: 103.86 mL/min (ref >60.00)
Glucose, Bld: 90 mg/dL (ref 70–99)
Potassium: 4.1 mEq/L (ref 3.5–5.1)
Sodium: 136 mEq/L (ref 135–145)
Total Bilirubin: 0.4 mg/dL (ref 0.2–1.2)
Total Protein: 7.8 g/dL (ref 6.0–8.3)

## 2023-02-03 LAB — LIPASE: Lipase: 13 U/L (ref 11.0–59.0)

## 2023-02-03 LAB — BRAIN NATRIURETIC PEPTIDE: Pro B Natriuretic peptide (BNP): 12 pg/mL (ref 0.0–100.0)

## 2023-02-03 MED ORDER — AMOXICILLIN-POT CLAVULANATE 875-125 MG PO TABS: 1.0000 | ORAL_TABLET | Freq: Two times a day (BID) | ORAL | 0 refills | Status: DC

## 2023-02-03 MED ORDER — LOSARTAN POTASSIUM-HCTZ 100-12.5 MG PO TABS: 1.0000 | ORAL_TABLET | Freq: Every day | ORAL | 3 refills | Status: DC

## 2023-02-03 MED ORDER — FAMOTIDINE 20 MG PO TABS: 20.0000 mg | ORAL_TABLET | Freq: Every day | ORAL | 0 refills | Status: DC

## 2023-02-03 NOTE — Progress Notes (Signed)
Subjective:    Patient ID: April Watkins, female    DOB: 29-May-1981, 42 y.o.   MRN: [id number]  HPI  Pt in with distal pretibial swelling and medial ankle swelling over past week to 10 days. Pt describes swelling was like pitting. But she states last 3-4 days much improved. Pt does stand all day at work. She is Investment banker, operational. When she had edema she had some mild shortness of breath.  Pt is on amlodipine for blood pressure. On review she had been on this for 2 years. Pt in past had been on losartan and hctz. But on review lost to follow up?  Pt states she not sleeping well. Also she is snoring a lot. Wakes up at least twice at night. Snoring for at least 2 years.  Obesity- she wants to lose weight. No hx of pancreatitis or thyromedullary cancer.  Pt has some occasional palpation. Pt had some bronchitis and was given duobenb and inhaler. She describes fast hr/side effect to albuterol.  Rt upper tooth pain for 2 days.     Review of Systems  Constitutional:  Negative for chills, fatigue and fever.  HENT:  Negative for congestion, drooling and ear discharge.   Respiratory:  Negative for cough, chest tightness, shortness of breath and wheezing.   Cardiovascular:  Negative for chest pain and palpitations.  Gastrointestinal:  Negative for abdominal pain, blood in stool, diarrhea and rectal pain.       Intermittent upset stomach. Random pain in upper abd.  Genitourinary:  Negative for dysuria, flank pain and frequency.  Musculoskeletal:  Negative for back pain, joint swelling and neck pain.  Skin:  Negative for rash.  Neurological:  Negative for dizziness, seizures, weakness and light-headedness.  Hematological:  Negative for adenopathy. Does not bruise/bleed easily.  Psychiatric/Behavioral:  Negative for behavioral problems, confusion and decreased concentration.     Past Medical History:  Diagnosis Date   Hypertension      Social History   Socioeconomic History   Marital status: Single     Spouse name: Not on file   Number of children: Not on file   Years of education: Not on file   Highest education level: 12th grade  Occupational History   Not on file  Tobacco Use   Smoking status: Every Day    Types: Cigarettes   Smokeless tobacco: Never  Vaping Use   Vaping Use: Never used  Substance and Sexual Activity   Alcohol use: Yes    Comment: social   Drug use: Yes    Types: Marijuana   Sexual activity: Yes    Birth control/protection: None  Other Topics Concern   Not on file  Social History Narrative   Not on file   Social Determinants of Health   Financial Resource Strain: Low Risk  (02/03/2023)   Overall Financial Resource Strain (CARDIA)    Difficulty of Paying Living Expenses: Not very hard  Food Insecurity: Food Insecurity Present (02/03/2023)   Hunger Vital Sign    Worried About Running Out of Food in the Last Year: Sometimes true    Ran Out of Food in the Last Year: Sometimes true  Transportation Needs: No Transportation Needs (02/03/2023)   PRAPARE - Administrator, Civil Service (Medical): No    Lack of Transportation (Non-Medical): No  Physical Activity: Unknown (02/03/2023)   Exercise Vital Sign    Days of Exercise per Week: 0 days    Minutes of Exercise per Session: Not on file  Stress: No Stress Concern Present (02/03/2023)   [employer] of Occupational Health - Occupational Stress Questionnaire    Feeling of Stress : Only a little  Social Connections: Moderately Isolated (02/03/2023)   Social Connection and Isolation Panel [NHANES]    Frequency of Communication with Friends and Family: More than three times a week    Frequency of Social Gatherings with Friends and Family: Once a week    Attends Religious Services: Never    Database administrator or Organizations: No    Attends Engineer, structural: Not on file    Marital Status: Married  Catering manager Violence: Not on file    No past surgical history on  file.  Family History  Problem Relation Age of Onset   Heart failure Mother    Hypertension Mother     No Known Allergies  Current Outpatient Medications on File Prior to Visit  Medication Sig Dispense Refill   amLODipine (NORVASC) 10 MG tablet Take 1 tablet (10 mg total) by mouth daily. 90 tablet 0   No current facility-administered medications on file prior to visit.    BP (!) 158/98 Comment: espac. prior reading machine  Pulse 90   Resp 18   Ht 5\' 2"  (1.575 m)   Wt 240 lb (108.9 kg)   LMP 01/15/2023 (Exact Date)   SpO2 100%   BMI 43.90 kg/m        Objective:   Physical Exam  General Mental Status- Alert. General Appearance- Not in acute distress.   Skin General: Color- Normal Color. Moisture- Normal Moisture.  Neck Carotid Arteries- Normal color. Moisture- Normal Moisture. No carotid bruits. No JVD.  Chest and Lung Exam Auscultation: Breath Sounds:-Normal.  Cardiovascular Auscultation:Rythm- Regular. Murmurs & Other Heart Sounds:Auscultation of the heart reveals- No Murmurs.  Abdomen Inspection:-Inspeection Normal. Palpation/Percussion:Note:No mass. Palpation and Percussion of the abdomen reveal- Non Tender, Non Distended + BS, no rebound or guarding.   Neurologic Cranial Nerve exam:- CN III-XII intact(No nystagmus), symmetric smile. Finger to Nose:- Normal/Intact Strength:- 5/5 equal and symmetric strength both upper and lower extremities.   Lower ext- no pedal edema. Negative homans signs.    Assessment & Plan:   Patient Instructions  1. Palpitations Normal sinus rhythm. I think you had side effect to albuterol as that can cause fast hr. Possible interpreted palpatation. If wheezing reoccurs would try other alternative med. If palpiation. Also minimize caffeine or stop. If palpitatoin reoccur then refer for ziopatch. - EKG 12-Lead  2. Tooth pain Rx augmentin and get scheduled with dentis.  3. Hypertension, unspecified type Continue  amlodipine 10 mg daily and restart losartan 100/12.5 mg daily.  - Comp Met (CMET)  4. Pedal edema Non presently. Consider compression socks. Maybe related to amlodopine but benefit exceeds side effect due to very high bp.  - B Nat Peptide - DG Chest 2 View; Future  5. Morbid obesity (HCC) Consider wegovy vs ozempic after lab review. May need A1c if sugar is elevated. Rx advisement. - Comp Met (CMET)  6. Pain of upper abdomen Avoid alcohol and eat better.  Rx famotadine - Lipase - CBC w/Diff - H. pylori breath test  7. Snoring  Referral placed to pulmonologist.  Follow up in 10 days or sooner if needed.    Esperanza Richters, PA-C   Time spent with patient today was  42 minutes which consisted of chart revdiew, discussing diagnosis, work up treatment and documentation.

## 2023-02-03 NOTE — Patient Instructions (Addendum)
1. Palpitations Normal sinus rhythm. I think you had side effect to albuterol as that can cause fast hr. Possible interpreted palpitation. If wheezing reoccurs would try other alternative med. If palpation. Also minimize caffeine or stop. If palpitations reoccur then refer for ziopatch. - EKG 12-Lead  2. Tooth pain Rx augmentin and get scheduled with dentis.  3. Hypertension, unspecified type Continue amlodipine 10 mg daily and restart losartan 100/12.5 mg daily.  - Comp Met (CMET)  4. Pedal edema Non presently. Consider compression socks. Maybe related to amlodopine but benefit exceeds side effect due to very high bp.  - B Nat Peptide - DG Chest 2 View; Future  5. Morbid obesity (HCC) Consider wegovy vs ozempic after lab review. May need A1c if sugar is elevated. Rx advisement. - Comp Met (CMET)  6. Pain of upper abdomen Avoid alcohol and eat better.  Rx famotadine - Lipase - CBC w/Diff - H. pylori breath test  7. Snoring  Referral placed to pulmonologist.  End of interview pt note late week some rt  popliteal pain at time she had pedal edema. So order rt lower ext [place] stat.   Follow up in 10 days or sooner if needed.

## 2023-02-04 ENCOUNTER — Ambulatory Visit (HOSPITAL_BASED_OUTPATIENT_CLINIC_OR_DEPARTMENT_OTHER)
Admission: RE | Admit: 2023-02-04 | Discharge: 2023-02-04 | Disposition: A | Discharge status: Home / Self Care | Payer: BC Managed Care – PPO | Source: Ambulatory Visit | Attending: Medical | Admitting: Medical

## 2023-02-04 DIAGNOSIS — M79609 Pain in unspecified limb: Secondary | ICD-10-CM | POA: Insufficient documentation

## 2023-02-04 DIAGNOSIS — R600 Localized edema: Secondary | ICD-10-CM

## 2023-02-04 DIAGNOSIS — M79604 Pain in right leg: Secondary | ICD-10-CM | POA: Diagnosis not present

## 2023-02-06 ENCOUNTER — Inpatient Hospital Stay (HOSPITAL_BASED_OUTPATIENT_CLINIC_OR_DEPARTMENT_OTHER): Admission: RE | Admit: 2023-02-06 | Payer: BC Managed Care – PPO | Source: Ambulatory Visit

## 2023-02-09 LAB — H. PYLORI BREATH TEST: H. pylori Breath Test: NOT DETECTED

## 2023-02-13 ENCOUNTER — Ambulatory Visit: Payer: BC Managed Care – PPO | Admitting: Medical

## 2023-02-23 ENCOUNTER — Institutional Professional Consult (permissible substitution): Payer: BC Managed Care – PPO | Admitting: Adult Health

## 2023-02-23 ENCOUNTER — Ambulatory Visit: Payer: BC Managed Care – PPO | Admitting: Medical

## 2023-02-27 ENCOUNTER — Ambulatory Visit (HOSPITAL_BASED_OUTPATIENT_CLINIC_OR_DEPARTMENT_OTHER): Payer: BC Managed Care – PPO

## 2023-03-02 DIAGNOSIS — R519 Headache, unspecified: Secondary | ICD-10-CM | POA: Diagnosis not present

## 2023-03-02 DIAGNOSIS — M62838 Other muscle spasm: Secondary | ICD-10-CM | POA: Diagnosis not present

## 2023-03-02 DIAGNOSIS — M542 Cervicalgia: Secondary | ICD-10-CM | POA: Diagnosis not present

## 2023-03-03 ENCOUNTER — Other Ambulatory Visit: Payer: Self-pay | Admitting: Medical

## 2023-03-13 ENCOUNTER — Institutional Professional Consult (permissible substitution): Payer: BC Managed Care – PPO | Admitting: Adult Health

## 2023-08-22 ENCOUNTER — Telehealth: Payer: Self-pay

## 2023-08-22 NOTE — Telephone Encounter (Signed)
Patient called stating she has unprotected intercourse and 3 positive pregnancy test. Patient states this morning she is bleeding like a regular period. Patient denies cramping/localized pain. Patient reports LMP over a month ago. Patient reports the pregnancy test were negative at first then positive an hour later. Advised patient most test need to be read within a 3 minute window. Patient scheduled to come into the office 11/13 for an office UPT.

## 2023-08-23 ENCOUNTER — Other Ambulatory Visit: Payer: Self-pay

## 2023-08-23 ENCOUNTER — Emergency Department (HOSPITAL_BASED_OUTPATIENT_CLINIC_OR_DEPARTMENT_OTHER)
Admission: EM | Admit: 2023-08-23 | Discharge: 2023-08-23 | Discharge status: Against Medical Advice | Payer: BC Managed Care – PPO | Attending: Emergency Medicine | Admitting: Emergency Medicine

## 2023-08-23 ENCOUNTER — Ambulatory Visit (INDEPENDENT_AMBULATORY_CARE_PROVIDER_SITE_OTHER): Payer: BC Managed Care – PPO

## 2023-08-23 VITALS — BP 173/121 | HR 83 | Ht 62.0 in | Wt 247.0 lb

## 2023-08-23 DIAGNOSIS — Z5321 Procedure and treatment not carried out due to patient leaving prior to being seen by health care provider: Secondary | ICD-10-CM | POA: Insufficient documentation

## 2023-08-23 DIAGNOSIS — Z3202 Encounter for pregnancy test, result negative: Secondary | ICD-10-CM

## 2023-08-23 DIAGNOSIS — Z3200 Encounter for pregnancy test, result unknown: Secondary | ICD-10-CM

## 2023-08-23 DIAGNOSIS — R030 Elevated blood-pressure reading, without diagnosis of hypertension: Secondary | ICD-10-CM | POA: Insufficient documentation

## 2023-08-23 LAB — POCT URINE PREGNANCY: Preg Test, Ur: NEGATIVE

## 2023-08-23 NOTE — Progress Notes (Signed)
Patient here for an in office urine pregnancy test. Patient reports three positive pregnancy test at home. Patient began bleeding yesterday and this morning. Patient reports feeling "off". Blood pressures elevated. Patient reports she did not take her medications today. Patient c/o headache. Patient had aleve at 1415 with no relief. Patient denies visual changes but reports swelling in both feet.  Blood observed in urine.   In office UPT: Negative    Patient instructed to take blood pressure medication and check blood pressure in 1 hour after taking prescribed medications. If patient is still symptomatic, patient is advised to go to the emergency room for further evaluation. Patient agrees with the following plan and declines evaluation in emergency room at this time.

## 2023-10-22 ENCOUNTER — Other Ambulatory Visit (HOSPITAL_BASED_OUTPATIENT_CLINIC_OR_DEPARTMENT_OTHER): Payer: Self-pay

## 2023-10-26 IMAGING — US US EXTREM LOW VENOUS
1 series · 13 of 24 positions shown · non-contrast
Comparison: None.

CLINICAL DATA: Bilateral lower extremity pain and edema.



[Series 1: us extrem low venous · 13 of 66 slices shown]
[im 1/66]
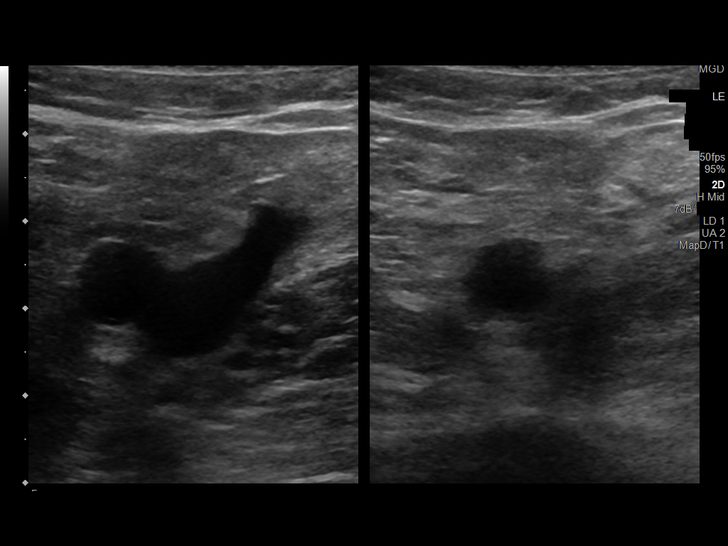
[im 6/66]
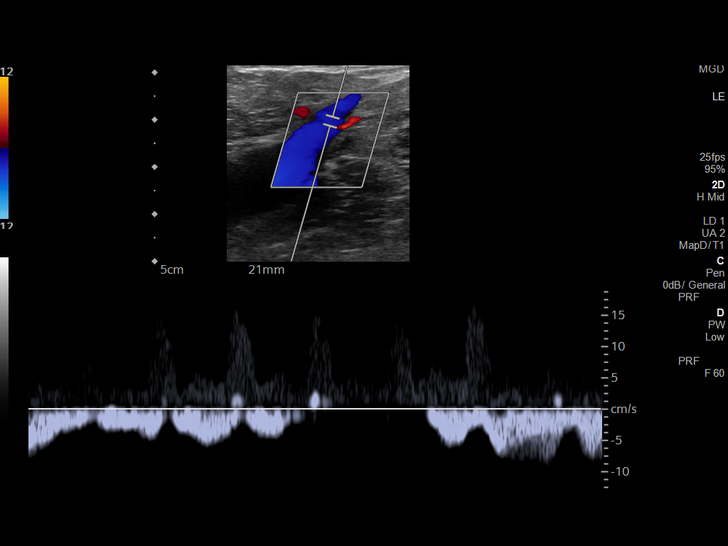
[im 12/66]
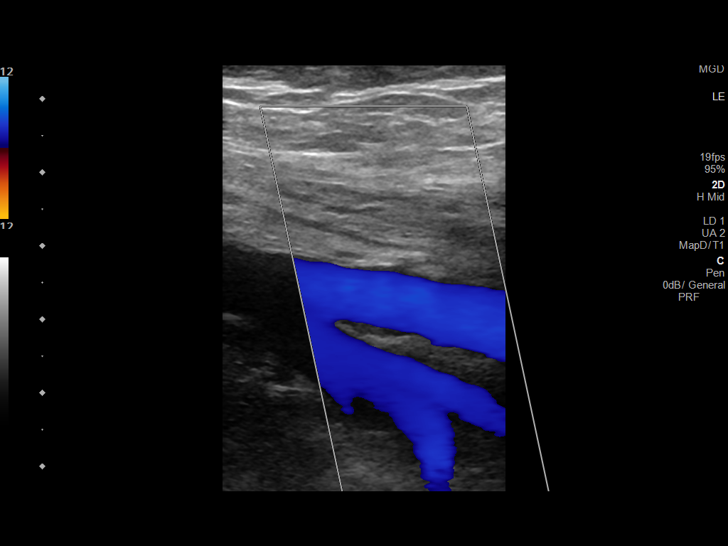
[im 17/66]
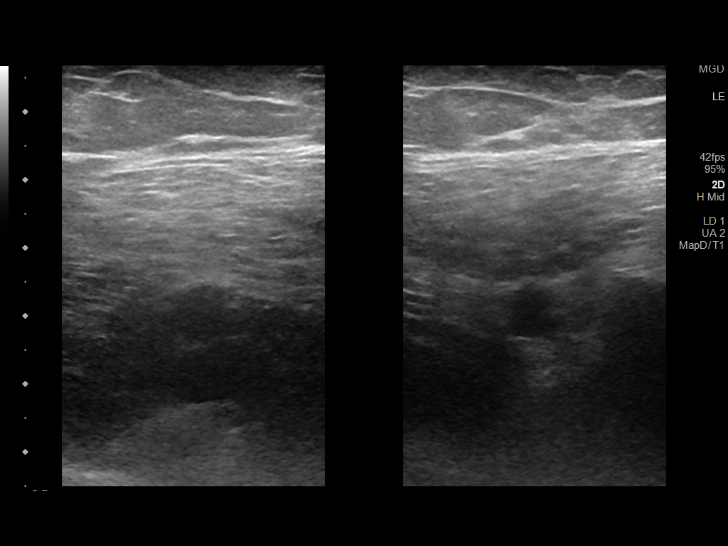
[im 23/66]
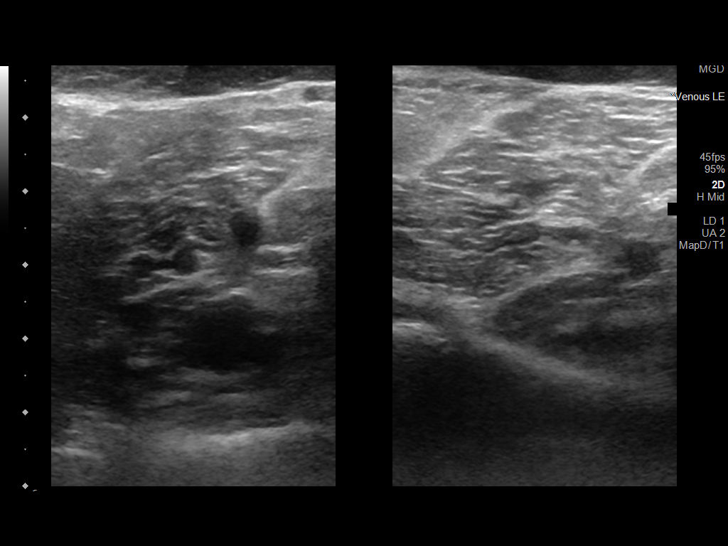
[im 29/66]
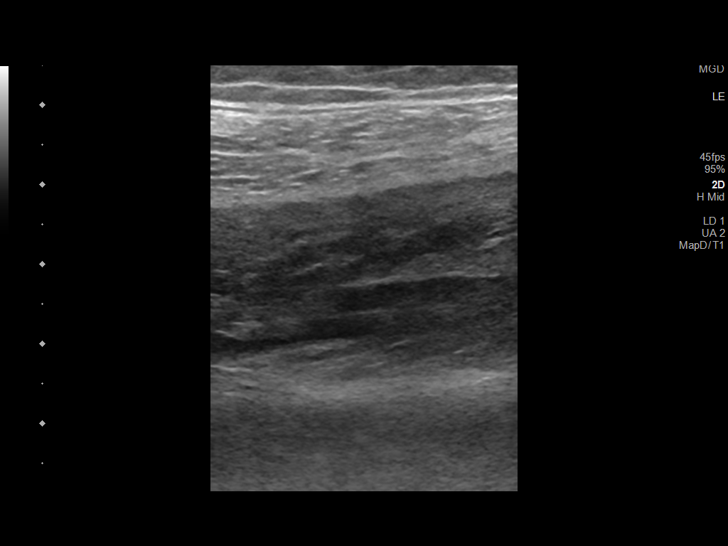
[im 34/66]
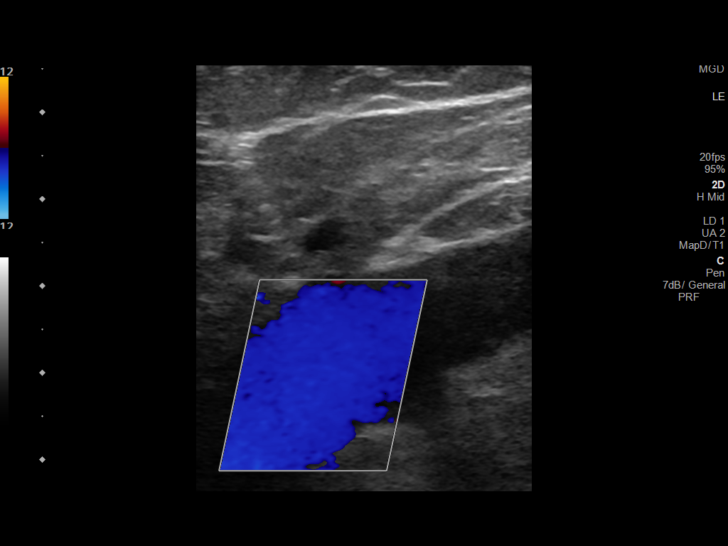
[im 37/66]
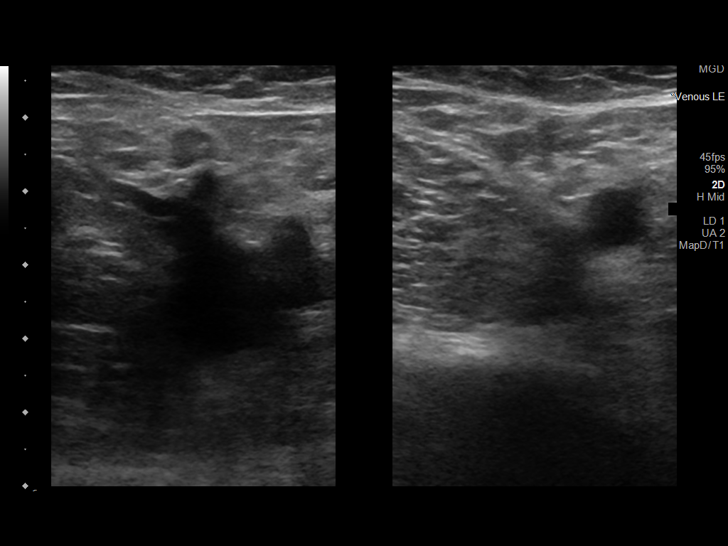
[im 43/66]
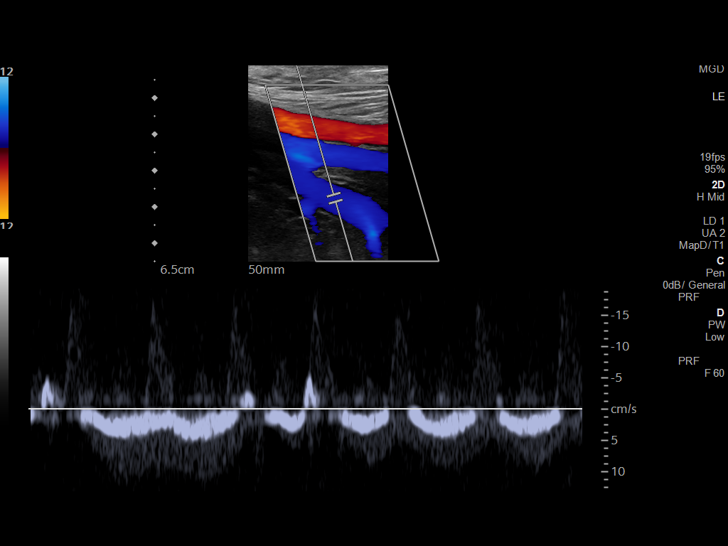
[im 49/66]
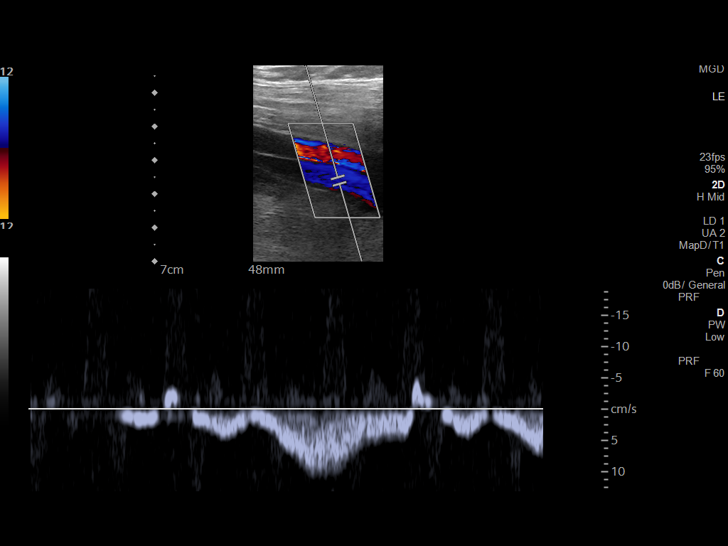
[im 54/66]
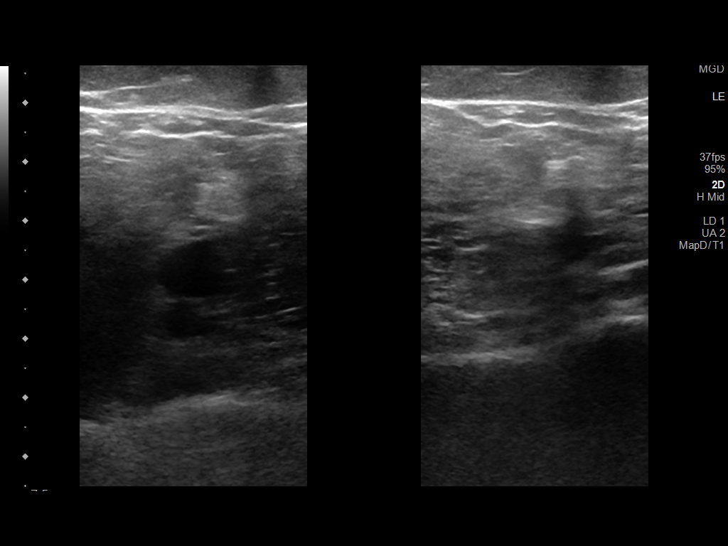
[im 60/66]
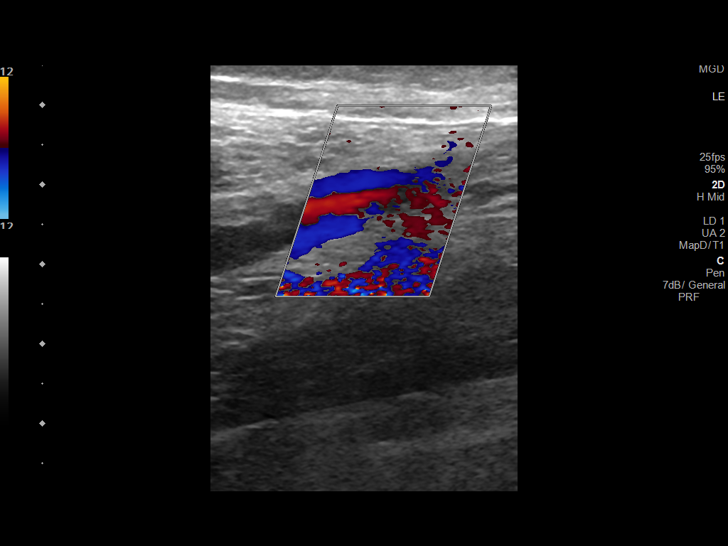
[im 66/66]
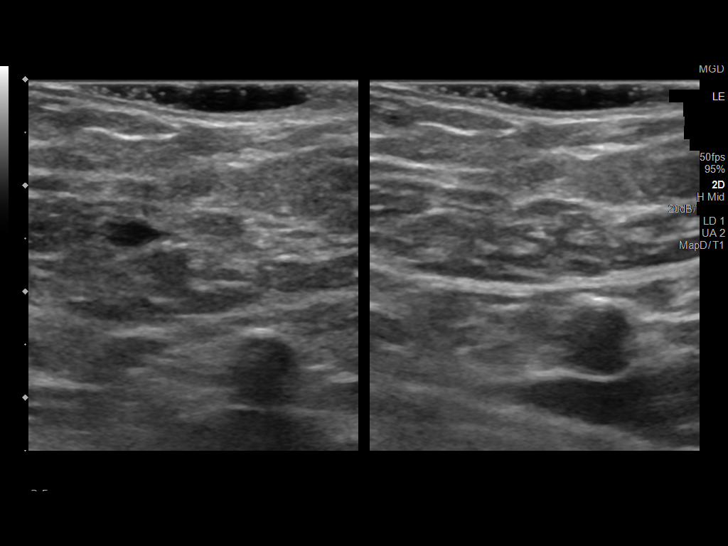

[13 of 24 positions shown; findings below may reference images not displayed]

FINDINGS: RIGHT LOWER EXTREMITY

Common Femoral Vein: No evidence of thrombus. Normal
compressibility, respiratory phasicity and response to augmentation.

Saphenofemoral Junction: No evidence of thrombus. Normal
compressibility and flow on color Doppler imaging.

Profunda Femoral Vein: No evidence of thrombus. Normal
compressibility and flow on color Doppler imaging.

Femoral Vein: No evidence of thrombus. Normal compressibility,
respiratory phasicity and response to augmentation.

Popliteal Vein: No evidence of thrombus. Normal compressibility,
respiratory phasicity and response to augmentation.

Calf Veins: No evidence of thrombus. Normal compressibility and flow
on color Doppler imaging.

Superficial Great Saphenous Vein: No evidence of thrombus. Normal
compressibility.

Venous Reflux:  None.

Other Findings:  None.

LEFT LOWER EXTREMITY

Common Femoral Vein: No evidence of thrombus. Normal
compressibility, respiratory phasicity and response to augmentation.

Saphenofemoral Junction: No evidence of thrombus. Normal
compressibility and flow on color Doppler imaging.

Profunda Femoral Vein: No evidence of thrombus. Normal
compressibility and flow on color Doppler imaging.

Femoral Vein: No evidence of thrombus. Normal compressibility,
respiratory phasicity and response to augmentation.

Popliteal Vein: No evidence of thrombus. Normal compressibility,
respiratory phasicity and response to augmentation.

Calf Veins: No evidence of thrombus. Normal compressibility and flow
on color Doppler imaging.

Superficial Great Saphenous Vein: No evidence of thrombus. Normal
compressibility.

Venous Reflux:  None.

Other Findings:  None.
IMPRESSION: No evidence of DVT within either lower extremity.

## 2023-10-30 ENCOUNTER — Ambulatory Visit: Payer: BC Managed Care – PPO | Admitting: Medical

## 2023-11-06 ENCOUNTER — Encounter: Payer: Self-pay | Admitting: Medical

## 2023-11-06 ENCOUNTER — Ambulatory Visit (HOSPITAL_BASED_OUTPATIENT_CLINIC_OR_DEPARTMENT_OTHER)
Admission: RE | Admit: 2023-11-06 | Discharge: 2023-11-06 | Disposition: A | Discharge status: Home / Self Care | Payer: BC Managed Care – PPO | Source: Ambulatory Visit | Attending: Medical | Admitting: Medical

## 2023-11-06 ENCOUNTER — Ambulatory Visit: Payer: BC Managed Care – PPO | Admitting: Medical

## 2023-11-06 VITALS — BP 168/90 | HR 100 | Resp 18 | Ht 62.0 in | Wt 249.0 lb

## 2023-11-06 DIAGNOSIS — K921 Melena: Secondary | ICD-10-CM

## 2023-11-06 DIAGNOSIS — R600 Localized edema: Secondary | ICD-10-CM | POA: Diagnosis not present

## 2023-11-06 DIAGNOSIS — R0602 Shortness of breath: Secondary | ICD-10-CM | POA: Diagnosis not present

## 2023-11-06 DIAGNOSIS — R0601 Orthopnea: Secondary | ICD-10-CM | POA: Insufficient documentation

## 2023-11-06 DIAGNOSIS — R0989 Other specified symptoms and signs involving the circulatory and respiratory systems: Secondary | ICD-10-CM | POA: Diagnosis not present

## 2023-11-06 DIAGNOSIS — R739 Hyperglycemia, unspecified: Secondary | ICD-10-CM

## 2023-11-06 DIAGNOSIS — L089 Local infection of the skin and subcutaneous tissue, unspecified: Secondary | ICD-10-CM

## 2023-11-06 DIAGNOSIS — R5383 Other fatigue: Secondary | ICD-10-CM

## 2023-11-06 DIAGNOSIS — I10 Essential (primary) hypertension: Secondary | ICD-10-CM | POA: Diagnosis not present

## 2023-11-06 DIAGNOSIS — L723 Sebaceous cyst: Secondary | ICD-10-CM

## 2023-11-06 LAB — CBC WITH DIFFERENTIAL/PLATELET
Basophils Absolute: 0 10*3/uL (ref 0.0–0.1)
Basophils Relative: 0.3 % (ref 0.0–3.0)
Eosinophils Absolute: 0.1 10*3/uL (ref 0.0–0.7)
Eosinophils Relative: 0.5 % (ref 0.0–5.0)
HCT: 42.2 % (ref 36.0–46.0)
Hemoglobin: 14.4 g/dL (ref 12.0–15.0)
Lymphocytes Relative: 35.7 % (ref 12.0–46.0)
Lymphs Abs: 4 10*3/uL (ref 0.7–4.0)
MCHC: 34.1 g/dL (ref 30.0–36.0)
MCV: 91.2 fL (ref 78.0–100.0)
Monocytes Absolute: 0.8 10*3/uL (ref 0.1–1.0)
Monocytes Relative: 7.4 % (ref 3.0–12.0)
Neutro Abs: 6.3 10*3/uL (ref 1.4–7.7)
Neutrophils Relative %: 56.1 % (ref 43.0–77.0)
Platelets: 249 10*3/uL (ref 150.0–400.0)
RBC: 4.63 Mil/uL (ref 3.87–5.11)
RDW: 14.2 % (ref 11.5–15.5)
WBC: 11.3 10*3/uL — ABNORMAL HIGH (ref 4.0–10.5)

## 2023-11-06 LAB — VITAMIN B12: Vitamin B-12: 135 pg/mL — ABNORMAL LOW (ref 211–911)

## 2023-11-06 LAB — COMPREHENSIVE METABOLIC PANEL
ALT: 22 U/L (ref 0–35)
AST: 22 U/L (ref 0–37)
Albumin: 4.4 g/dL (ref 3.5–5.2)
Alkaline Phosphatase: 51 U/L (ref 39–117)
BUN: 14 mg/dL (ref 6–23)
CO2: 28 meq/L (ref 19–32)
Calcium: 9.5 mg/dL (ref 8.4–10.5)
Chloride: 102 meq/L (ref 96–112)
Creatinine, Ser: 0.8 mg/dL (ref 0.40–1.20)
GFR: 89.52 mL/min (ref >60.00)
Glucose, Bld: 86 mg/dL (ref 70–99)
Potassium: 3.6 meq/L (ref 3.5–5.1)
Sodium: 136 meq/L (ref 135–145)
Total Bilirubin: 0.3 mg/dL (ref 0.2–1.2)
Total Protein: 7.5 g/dL (ref 6.0–8.3)

## 2023-11-06 LAB — HEMOGLOBIN A1C: Hgb A1c MFr Bld: 5.6 % (ref 4.6–6.5)

## 2023-11-06 LAB — BRAIN NATRIURETIC PEPTIDE: Pro B Natriuretic peptide (BNP): 59 pg/mL (ref 0.0–100.0)

## 2023-11-06 LAB — TSH: TSH: 1.06 u[IU]/mL (ref 0.35–5.50)

## 2023-11-06 MED ORDER — AMLODIPINE BESYLATE 10 MG PO TABS: 10.0000 mg | ORAL_TABLET | Freq: Every day | ORAL | 3 refills | Status: AC

## 2023-11-06 MED ORDER — VALSARTAN-HYDROCHLOROTHIAZIDE 160-12.5 MG PO TABS: 1.0000 | ORAL_TABLET | Freq: Every day | ORAL | 0 refills | Status: AC

## 2023-11-06 NOTE — Patient Instructions (Signed)
Hypertension Uncontrolled, off medication for two weeks. No acute symptoms of hypertensive emergency. -Start Valsartan 160-12.5mg  daily and Amlodipine 10mg  daily. -Check blood pressure in one week. -Advise patient to purchase home blood pressure monitor. -if any cardiac or neurologic signs/symtpoms be seen in ED. -normal neurologic exam presently.  Lower extremity edema Chronic, worsening. Nocturnal orthopnea. -Order chest X-ray and BNP to evaluate for heart failure.  Rectal bleeding Bright red blood per rectum for one day, resolved. No pain, no history of hemorrhoids. Recent loose stools. -Refer to GI for further evaluation. -Check CBC to evaluate for anemia. -if recurrent bright red blood pending referral to GI MD notify me.  Fatigue Recent onset, may be related to new job with early morning hours. -Order metabolic panel, B12, B1, TSH. -Check A1c due to past mild sugar elevations.  Sebaceous cyst Chronic, intermittently inflamed. -Start Keflex 500mg  twice daily for 10 days. -Refer to dermatology for possible removal.  Carpal tunnel syndrome Recent onset, worse at night and with repetitive activity. -Advise over-the-counter wrist splints. -Avoid anti-inflammatories due to hypertension. When bp tight controlled in future can advise on dosing and when to start.   follow up one week or sooner if needed

## 2023-11-06 NOTE — Addendum Note (Signed)
Addended by: Gwenevere Abbot on: 11/06/2023 02:07 PM   Modules accepted: Orders

## 2023-11-06 NOTE — Progress Notes (Signed)
Subjective:    Patient ID: April Watkins, female    DOB: 10-02-1981, 43 y.o.   MRN: [id number]  HPI  Discussed the use of AI scribe software for clinical note transcription with the patient, who gave verbal consent to proceed.  History of Present Illness   The patient, with a history of hypertension, presented with multiple complaints. They reported a two-week lapse in their antihypertensive medication due to misplacement, which coincided with an increase in blood pressure. They also reported persistent lower extremity edema. Despite attempts at weight loss and dietary changes, they noted no improvement.    The patient also reported new-onset paresthesia in their hands, described as tingling and tightness, particularly at night and during repetitive activities at work. 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  Additionally, they reported a single episode of bright red blood in their stool, which occurred the day prior to the consultation. This was associated with multiple bowel movements throughout the day but resolved by the evening. They denied any associated abdominal pain, but noted a history of loose stools and rectal irritation a week and a half prior to the bleeding episode.  The patient also reported a one-year history of a tender lesion near their scapula, which intermittently discharges a white substance when squeezed. They reported recent changes in the lesion, including increased itchiness and irritation.  Lastly, the patient reported recent fatigue, but was uncertain if this was related to their new job requiring early morning starts. They denied any associated headache, nausea, vomiting, or vision changes.       Past Medical History:  Diagnosis Date   Hypertension      Social History   Socioeconomic History   Marital status: Single    Spouse name: Not on file   Number of children: Not on file   Years of  education: Not on file   Highest education level: 12th grade  Occupational History   Not on file  Tobacco Use   Smoking status: Every Day    Types: Cigarettes   Smokeless tobacco: Never  Vaping Use   Vaping status: Never Used  Substance and Sexual Activity   Alcohol use: Yes    Comment: social   Drug use: Yes    Types: Marijuana   Sexual activity: Yes    Birth control/protection: None  Other Topics Concern   Not on file  Social History Narrative   Not on file   Social Drivers of Health   Financial Resource Strain: Low Risk  (02/03/2023)   Overall Financial Resource Strain (CARDIA)    Difficulty of Paying Living Expenses: Not very hard  Food Insecurity: Food Insecurity Present (02/03/2023)   Hunger Vital Sign    Worried About Running Out of Food in the Last Year: Sometimes true    Ran Out of Food in the Last Year: Sometimes true  Transportation Needs: No Transportation Needs (02/03/2023)   PRAPARE - Administrator, Civil Service (Medical): No    Lack of Transportation (Non-Medical): No  Physical Activity: Unknown (02/03/2023)   Exercise Vital Sign    Days of Exercise per Week: 0 days    Minutes of Exercise per Session: Not on file  Stress: No Stress Concern Present (02/03/2023)   [employer] of Occupational Health - Occupational Stress Questionnaire    Feeling of Stress : Only a little  Social Connections: Moderately Isolated (02/03/2023)   Social Connection  and Isolation Panel [NHANES]    Frequency of Communication with Friends and Family: More than three times a week    Frequency of Social Gatherings with Friends and Family: Once a week    Attends Religious Services: Never    Database administrator or Organizations: No    Attends Engineer, structural: Not on file    Marital Status: Married  Catering manager Violence: Not on file    No past surgical history on file.  Family History  Problem Relation Age of Onset   Heart failure Mother     Hypertension Mother     No Known Allergies  Current Outpatient Medications on File Prior to Visit  Medication Sig Dispense Refill   amLODipine (NORVASC) 10 MG tablet Take 1 tablet (10 mg total) by mouth daily. 90 tablet 0   famotidine (PEPCID) 20 MG tablet TAKE 1 TABLET(20 MG) BY MOUTH DAILY 90 tablet 0   No current facility-administered medications on file prior to visit.    BP (!) 168/90   Pulse 100   Resp 18   Ht 5\' 2"  (1.575 m)   Wt 249 lb (112.9 kg)   SpO2 98% Comment: lying supine  BMI 45.54 kg/m         Review of Systems  Constitutional:  Negative for chills and fatigue.  HENT:  Negative for congestion, ear pain and postnasal drip.   Respiratory:  Negative for cough, chest tightness, shortness of breath and wheezing.        Orthopnea  Cardiovascular:  Negative for chest pain and palpitations.  Gastrointestinal:  Positive for blood in stool. Negative for abdominal pain, constipation, nausea and vomiting.  Genitourinary:  Negative for dysuria.  Musculoskeletal:  Negative for back pain and joint swelling.       Pedal edema.  Skin:  Negative for rash.       See hpi.  Neurological:  Negative for dizziness, seizures, syncope, weakness and headaches.       See hpi on hand tingles. Cts like mostly.  Hematological:  Negative for adenopathy. Does not bruise/bleed easily.  Psychiatric/Behavioral:  Negative for behavioral problems and decreased concentration.     Past Medical History:  Diagnosis Date   Hypertension      Social History   Socioeconomic History   Marital status: Single    Spouse name: Not on file   Number of children: Not on file   Years of education: Not on file   Highest education level: 12th grade  Occupational History   Not on file  Tobacco Use   Smoking status: Every Day    Types: Cigarettes   Smokeless tobacco: Never  Vaping Use   Vaping status: Never Used  Substance and Sexual Activity   Alcohol use: Yes    Comment: social   Drug  use: Yes    Types: Marijuana   Sexual activity: Yes    Birth control/protection: None  Other Topics Concern   Not on file  Social History Narrative   Not on file   Social Drivers of Health   Financial Resource Strain: Low Risk  (02/03/2023)   Overall Financial Resource Strain (CARDIA)    Difficulty of Paying Living Expenses: Not very hard  Food Insecurity: Food Insecurity Present (02/03/2023)   Hunger Vital Sign    Worried About Running Out of Food in the Last Year: Sometimes true    Ran Out of Food in the Last Year: Sometimes true  Transportation Needs: No Transportation  Needs (02/03/2023)   PRAPARE - Administrator, Civil Service (Medical): No    Lack of Transportation (Non-Medical): No  Physical Activity: Unknown (02/03/2023)   Exercise Vital Sign    Days of Exercise per Week: 0 days    Minutes of Exercise per Session: Not on file  Stress: No Stress Concern Present (02/03/2023)   [employer] of Occupational Health - Occupational Stress Questionnaire    Feeling of Stress : Only a little  Social Connections: Moderately Isolated (02/03/2023)   Social Connection and Isolation Panel [NHANES]    Frequency of Communication with Friends and Family: More than three times a week    Frequency of Social Gatherings with Friends and Family: Once a week    Attends Religious Services: Never    Database administrator or Organizations: No    Attends Engineer, structural: Not on file    Marital Status: Married  Catering manager Violence: Not on file    No past surgical history on file.  Family History  Problem Relation Age of Onset   Heart failure Mother    Hypertension Mother     No Known Allergies  Current Outpatient Medications on File Prior to Visit  Medication Sig Dispense Refill   amLODipine (NORVASC) 10 MG tablet Take 1 tablet (10 mg total) by mouth daily. 90 tablet 0   famotidine (PEPCID) 20 MG tablet TAKE 1 TABLET(20 MG) BY MOUTH DAILY 90 tablet 0    No current facility-administered medications on file prior to visit.    BP (!) 168/90   Pulse 100   Resp 18   Ht 5\' 2"  (1.575 m)   Wt 249 lb (112.9 kg)   SpO2 99%   BMI 45.54 kg/m        Objective:   Physical Exam   General Mental Status- Alert. General Appearance- Not in acute distress.    Neck Carotid Arteries- Normal color. Moisture- Normal Moisture. No carotid bruits. No JVD.  Chest and Lung Exam Auscultation: Breath Sounds:-Normal.  Cardiovascular Auscultation:Rythm- Regular. Murmurs & Other Heart Sounds:Auscultation of the heart reveals- No Murmurs.  Abdomen Inspection:-Inspeection Normal. Palpation/Percussion:Note:No mass. Palpation and Percussion of the abdomen reveal- Non Tender, Non Distended + BS, no rebound or guarding.   Neurologic Cranial Nerve exam:- CN III-XII intact(No nystagmus), symmetric smile. Drift Test:- No drift. Finger to Nose:- Normal/Intact Strength:- 5/5 equal and symmetric strength both upper and lower extremities.    Lower ext- calfs symmetric, no pedal edema and negative homans signs.  Skin- rt upper back sebaceous cyst mild swelling with pinkish tone to skin and mild tender. No fluctuance.  Rectal- on inspection with chaperone hannah. No external hemmroid seen on inspection.    Assessment & Plan:   Assessment and Plan    Hypertension Uncontrolled, off medication for two weeks. No acute symptoms of hypertensive emergency. -Start Valsartan 160-12.5mg  daily and Amlodipine 10mg  daily. -Check blood pressure in one week. -Advise patient to purchase home blood pressure monitor. -if any cardiac or neurologic signs/symtpoms be seen in ED. -normal neurologic exam presently.  Lower extremity edema Chronic, worsening. Nocturnal orthopnea. -Order chest X-ray and BNP to evaluate for heart failure.  Rectal bleeding Bright red blood per rectum for one day, resolved. No pain, no history of hemorrhoids. Recent loose stools. -Refer  to GI for further evaluation. -Check CBC to evaluate for anemia. -if recurrent bright red blood pending referral to GI MD notify me.  Fatigue Recent onset, may be related to new  job with early morning hours. -Order metabolic panel, B12, B1, TSH. -Check A1c due to past mild sugar elevations.  Sebaceous cyst Chronic, intermittently inflamed. -Start Keflex 500mg  twice daily for 10 days. -Refer to dermatology for possible removal.  Carpal tunnel syndrome Recent onset, worse at night and with repetitive activity. -Advise over-the-counter wrist splints. -Avoid anti-inflammatories due to hypertension. When bp tight controlled in future can advise on dosing and when to start.   follow up one week or sooner if needed   Esperanza Richters, PA-C        Time spent with patient today was 44  minutes which consisted of chart review, discussing diagnosis, work up treatment and documentation.

## 2023-11-10 LAB — VITAMIN B1: Vitamin B1 (Thiamine): 11 nmol/L (ref 8–30)

## 2023-11-13 ENCOUNTER — Ambulatory Visit: Payer: BC Managed Care – PPO | Admitting: Medical

## 2023-11-20 ENCOUNTER — Ambulatory Visit: Payer: BC Managed Care – PPO | Admitting: Medical

## 2023-11-27 ENCOUNTER — Telehealth: Payer: Self-pay | Admitting: Medical

## 2023-11-27 NOTE — Telephone Encounter (Signed)
 Pt scheduled

## 2023-11-27 NOTE — Telephone Encounter (Signed)
 Copied from CRM [id number]. Topic: Appointments - Scheduling Inquiry for Clinic >> Nov 27, 2023 10:01 AM April Watkins wrote: Reason for CRM: Patient wants to schedule appointment for b12 injection, was told by CAL it has to be approved by the doctor first so once it is approved, can you contact the patient to schedule visit?

## 2023-11-28 ENCOUNTER — Ambulatory Visit (INDEPENDENT_AMBULATORY_CARE_PROVIDER_SITE_OTHER): Payer: Commercial Managed Care - PPO

## 2023-11-28 ENCOUNTER — Ambulatory Visit: Payer: Commercial Managed Care - PPO

## 2023-11-28 DIAGNOSIS — E538 Deficiency of other specified B group vitamins: Secondary | ICD-10-CM

## 2023-11-28 MED ORDER — CYANOCOBALAMIN 1000 MCG/ML IJ SOLN
1000.0000 ug | Freq: Once | INTRAMUSCULAR | Status: AC
  Administered 2023-11-28: 1000 ug via INTRAMUSCULAR

## 2023-11-28 NOTE — Progress Notes (Signed)
 Pt here for first weekly B12 injection per Marisue Brooklyn  B12 given IM, and pt tolerated injection well.  Next B12 injection scheduled for next week

## 2023-12-05 ENCOUNTER — Ambulatory Visit (INDEPENDENT_AMBULATORY_CARE_PROVIDER_SITE_OTHER): Payer: Commercial Managed Care - PPO

## 2023-12-05 DIAGNOSIS — E538 Deficiency of other specified B group vitamins: Secondary | ICD-10-CM

## 2023-12-05 MED ORDER — CYANOCOBALAMIN 1000 MCG/ML IJ SOLN
1000.0000 ug | Freq: Once | INTRAMUSCULAR | Status: AC
  Administered 2023-12-05: 1000 ug via INTRAMUSCULAR

## 2023-12-05 NOTE — Progress Notes (Signed)
Pt here for weekly B12 injection per Edward Saguier,PA-C  B12 1000mcg given IM, and pt tolerated injection well.  Next B12 injection scheduled for next week 

## 2023-12-11 ENCOUNTER — Ambulatory Visit: Payer: Commercial Managed Care - PPO | Admitting: Medical

## 2023-12-12 ENCOUNTER — Ambulatory Visit (INDEPENDENT_AMBULATORY_CARE_PROVIDER_SITE_OTHER): Payer: Commercial Managed Care - PPO

## 2023-12-12 DIAGNOSIS — E538 Deficiency of other specified B group vitamins: Secondary | ICD-10-CM

## 2023-12-12 MED ORDER — CYANOCOBALAMIN 1000 MCG/ML IJ SOLN
1000.0000 ug | Freq: Once | INTRAMUSCULAR | Status: AC
  Administered 2023-12-12: 1000 ug via INTRAMUSCULAR

## 2023-12-12 NOTE — Progress Notes (Signed)
 Pt here for 3/4 weekly B12 injection per Marisue Brooklyn   B12 given IM, and pt tolerated injection well.  Next B12 injection scheduled for next week

## 2023-12-19 ENCOUNTER — Telehealth: Payer: Self-pay

## 2023-12-19 ENCOUNTER — Ambulatory Visit (INDEPENDENT_AMBULATORY_CARE_PROVIDER_SITE_OTHER)

## 2023-12-19 DIAGNOSIS — E538 Deficiency of other specified B group vitamins: Secondary | ICD-10-CM | POA: Diagnosis not present

## 2023-12-19 MED ORDER — CYANOCOBALAMIN 1000 MCG/ML IJ SOLN
1000.0000 ug | Freq: Once | INTRAMUSCULAR | Status: AC
  Administered 2023-12-19: 1000 ug via INTRAMUSCULAR

## 2023-12-19 NOTE — Progress Notes (Signed)
 Pt here for weekly B12 injection per Marisue Brooklyn  B12 given IM, and pt tolerated injection well.  Next B12 injection scheduled for next month

## 2023-12-19 NOTE — Telephone Encounter (Signed)
 Pt came in for last weekly b12 injection stated , she is still tired and would like to know if she can take OTC since she is about to start doing monthly

## 2023-12-21 NOTE — Telephone Encounter (Signed)
 Pt called and notified

## 2024-01-18 ENCOUNTER — Ambulatory Visit

## 2024-01-18 DIAGNOSIS — E538 Deficiency of other specified B group vitamins: Secondary | ICD-10-CM | POA: Diagnosis not present

## 2024-01-18 MED ORDER — CYANOCOBALAMIN 1000 MCG/ML IJ SOLN
1000.0000 ug | Freq: Once | INTRAMUSCULAR | Status: AC
  Administered 2024-01-18: 1000 ug via INTRAMUSCULAR

## 2024-01-18 NOTE — Progress Notes (Signed)
 Pt here for monthly B12 injection per April Watkins  B12 given IM, and pt tolerated injection well.  Next B12 injection scheduled for

## 2024-01-23 ENCOUNTER — Ambulatory Visit

## 2024-01-24 ENCOUNTER — Ambulatory Visit: Payer: Self-pay

## 2024-01-24 NOTE — Telephone Encounter (Signed)
 Copied from CRM [id number]. Topic: Clinical - Red Word Triage >> Jan 24, 2024  2:46 PM Luane Rumps D wrote: Red Word that prompted transfer to Nurse Triage: Swollen, Red eye infection, gooyeness, and some blood.   Chief Complaint: eye drainage Symptoms: eye drainage L eye, redness, swelling, irritation, blood in L eye  Frequency: 2-3 days ago  Pertinent Negatives: Patient denies vision changes or pain Disposition: [] ED /[] Urgent Care (no appt availability in office) / [x] Appointment(In office/virtual)/ []  [place] [facility]/ [] Home Care/ [] Refused Recommended Disposition /[]  Mobile Bus/ []  Follow-up with PCP Additional Notes: pt unsure if she scratched L eye during sleep or what caused sx but thought may be pink eye but unsure. Got OTC eye gtts but not getting relief. Scheduled OV for tomorrow at 3 with [place], [place]. Other earlier appts were available but pt working so advised if sx get worse to call back and r/s for earlier time. Also recommended warm compresses. Pt verbalized understanding.   Reason for Disposition  [1] Eye with yellow or green discharge, or eyelashes stick together AND [2] NO PCP standing order to call in antibiotic eye drops   (Exception: [place]; continue triage.)  Answer Assessment - Initial Assessment Questions 1. EYE DISCHARGE: "Is the discharge in one or both eyes?" "What color is it?" "How much is there?" "When did the discharge start?"      Min L eye  2. REDNESS OF SCLERA: "Is the redness in one or both eyes?" "When did the redness start?"      Redness  3. EYELIDS: "Are the eyelids red or swollen?" If Yes, ask: "How much?"      yes 5. PAIN: "Is there any pain? If Yes, ask: "How bad is it?" (Scale 1-10; or mild, moderate, severe)    - MILD (1-3): doesn't interfere with normal activities     - MODERATE (4-7): interferes with normal activities or awakens from sleep    - SEVERE (8-10): excruciating pain, unable to do any normal activities       irritation 6.  CONTACT LENS: "Do you wear contacts?"     no 7. OTHER SYMPTOMS: "Do you have any other symptoms?" (e.g., fever, runny nose, cough)     Crusting over at times, blood inside eye  Protocols used: Eye - Pus or Discharge-A-AH

## 2024-01-25 ENCOUNTER — Encounter: Payer: Self-pay | Admitting: Physician Assistant

## 2024-01-25 ENCOUNTER — Ambulatory Visit (INDEPENDENT_AMBULATORY_CARE_PROVIDER_SITE_OTHER): Admitting: Physician Assistant

## 2024-01-25 VITALS — BP 155/97 | HR 80 | Ht 62.0 in | Wt 246.6 lb

## 2024-01-25 DIAGNOSIS — H5712 Ocular pain, left eye: Secondary | ICD-10-CM | POA: Diagnosis not present

## 2024-01-25 DIAGNOSIS — L723 Sebaceous cyst: Secondary | ICD-10-CM | POA: Diagnosis not present

## 2024-01-25 DIAGNOSIS — I10 Essential (primary) hypertension: Secondary | ICD-10-CM | POA: Insufficient documentation

## 2024-01-25 DIAGNOSIS — L089 Local infection of the skin and subcutaneous tissue, unspecified: Secondary | ICD-10-CM

## 2024-01-25 MED ORDER — ERYTHROMYCIN 5 MG/GM OP OINT: 1.0000 | TOPICAL_OINTMENT | Freq: Every day | OPHTHALMIC | 0 refills | Status: AC

## 2024-01-25 MED ORDER — CEPHALEXIN 500 MG PO CAPS
500.0000 mg | ORAL_CAPSULE | Freq: Two times a day (BID) | ORAL | 0 refills | Status: AC
Start: 2024-01-25 — End: 2024-02-01

## 2024-01-25 NOTE — Progress Notes (Signed)
 Established patient visit   Patient: April Watkins   DOB: 1981-09-26   43 y.o. Female  MRN: [id number] Visit Date: 01/25/2024  Today's healthcare provider: Alfredia Ferguson, PA-C   Cc. Eye pain  Subjective     Discussed the use of AI scribe software for clinical note transcription with the patient, who gave verbal consent to proceed.  History of Present Illness   The patient, a chef by profession, presents with a chief complaint of an irritated, swollen, and red left eye. The patient reports that the eye issue started two days ago, possibly due to pressure from sleeping with her hand on her eye. The patient has been using regular eye drops and warm compresses to alleviate the discomfort. The patient also reports waking up with the eye stuck together and swollen, and fears it will worsen overnight.  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 The patient reports that these have been recurring and have previously required medical intervention, including incision and drainage. The patient has been self-managing the current boil with iodine and gauze, and reports pus drainage.  The patient also has a history of high blood pressure, for which she has been prescribed medication. However, the patient admits to forgetting to take the medication regularly, which has resulted in persistently high blood pressure readings.      Medications: Outpatient Medications Prior to Visit  Medication Sig   amLODipine (NORVASC) 10 MG tablet Take 1 tablet (10 mg total) by mouth daily.   famotidine (PEPCID) 20 MG tablet TAKE 1 TABLET(20 MG) BY MOUTH DAILY   valsartan-hydrochlorothiazide (DIOVAN-HCT) 160-12.5 MG tablet Take 1 tablet by mouth daily.   No facility-administered medications prior to visit.    Review of Systems  Constitutional:  Negative for fatigue and fever.  Eyes:  Positive for pain,  discharge and redness.  Respiratory:  Negative for cough and shortness of breath.   Cardiovascular:  Negative for chest pain and leg swelling.  Gastrointestinal:  Negative for abdominal pain.  Neurological:  Negative for dizziness and headaches.       Objective    BP (!) 155/97   Pulse 80   Ht 5\' 2"  (1.575 m)   Wt 246 lb 9.6 oz (111.9 kg)   BMI 45.10 kg/m    Physical Exam Vitals reviewed.  Constitutional:      Appearance: She is not ill-appearing.  HENT:     Head: Normocephalic.  Eyes:     Conjunctiva/sclera:     Left eye: Hemorrhage present.  Cardiovascular:     Rate and Rhythm: Normal rate.  Pulmonary:     Effort: Pulmonary effort is normal. No respiratory distress.  Neurological:     Mental Status: She is alert and oriented to person, place, and time.  Psychiatric:        Mood and Affect: Mood normal.        Behavior: Behavior normal.    Woods lamp exam completed, no sign of corneal abrasion  No results found for any visits on 01/25/24.  Assessment & Plan    Primary hypertension Assessment & Plan: Stressed importance of taking BP meds consistently and dangers of high blood pressure   Infected sebaceous cyst vs pilonidal cyst. -     Cephalexin; Take 1 capsule (500 mg total) by mouth 2 (two) times daily for 7 days.  Dispense: 14 capsule; Refill:  0  Left eye pain Advised pt to continue warm compresses, that the hemorrhage will resolve in time No sign of corneal abrasion   Given we are going into the weekend--- if symptoms worsen, rx erythromycin ointment -     Erythromycin; Place 1 Application into the left eye at bedtime. Place 0.5 cm ribbon into left eye at bedtime for 7 days  Dispense: 3.5 g; Refill: 0  Return if symptoms worsen or fail to improve.       Trenton Frock, PA-C  [facility] [phone] (phone) [phone] (fax)  [facility]

## 2024-01-25 NOTE — Assessment & Plan Note (Signed)
 Stressed importance of taking BP meds consistently and dangers of high blood pressure

## 2024-02-20 ENCOUNTER — Ambulatory Visit

## 2024-02-20 DIAGNOSIS — E538 Deficiency of other specified B group vitamins: Secondary | ICD-10-CM | POA: Diagnosis not present

## 2024-02-20 MED ORDER — CYANOCOBALAMIN 1000 MCG/ML IJ SOLN
1000.0000 ug | Freq: Once | INTRAMUSCULAR | Status: AC
Start: 2024-02-20 — End: 2024-02-20
  Administered 2024-02-20: 1000 ug via INTRAMUSCULAR

## 2024-02-20 NOTE — Progress Notes (Signed)
  Last B12 injection:01/18/24  Last B12 level:  11/06/23  B12 1000mcg given IM, and pt tolerated injection well.  Next B12 injection scheduled for:  next month

## 2024-03-19 ENCOUNTER — Ambulatory Visit

## 2024-03-22 ENCOUNTER — Inpatient Hospital Stay: Admitting: Medical

## 2024-04-05 ENCOUNTER — Telehealth: Payer: Self-pay | Admitting: Medical

## 2024-04-05 ENCOUNTER — Inpatient Hospital Stay: Admitting: Medical

## 2024-04-05 NOTE — Telephone Encounter (Signed)
 Copied from CRM [id number]. Topic: General - Other >> Apr 05, 2024  1:50 PM Robinson H wrote: Reason for CRM: Patient call to reschedule hospital follow up appointment for today at 3:20 with Edward Saguier, rescheduled to 7/1 at 3:40 pm, states she's stuck at work

## 2024-04-09 ENCOUNTER — Inpatient Hospital Stay: Admitting: Medical

## 2024-04-09 ENCOUNTER — Telehealth: Payer: Self-pay | Admitting: Medical

## 2024-04-09 NOTE — Telephone Encounter (Signed)
 Copied from CRM [id number]. Topic: General - Other >> Apr 09, 2024  2:58 PM Robinson H wrote: Reason for CRM: Patient called to reschedule appointment states she doesn't feel well and going to the emergency room rescheduled to 7/9 at 3:20

## 2024-04-17 ENCOUNTER — Inpatient Hospital Stay: Admitting: Medical

## 2024-06-28 ENCOUNTER — Other Ambulatory Visit: Payer: Self-pay

## 2024-06-28 ENCOUNTER — Emergency Department (HOSPITAL_BASED_OUTPATIENT_CLINIC_OR_DEPARTMENT_OTHER)
Admission: EM | Admit: 2024-06-28 | Discharge: 2024-06-29 | Disposition: A | Discharge status: Home / Self Care | Payer: Worker's Compensation | Attending: Emergency Medicine | Admitting: Emergency Medicine

## 2024-06-28 ENCOUNTER — Encounter (HOSPITAL_BASED_OUTPATIENT_CLINIC_OR_DEPARTMENT_OTHER): Payer: Self-pay | Admitting: Emergency Medicine

## 2024-06-28 ENCOUNTER — Emergency Department (HOSPITAL_BASED_OUTPATIENT_CLINIC_OR_DEPARTMENT_OTHER)

## 2024-06-28 DIAGNOSIS — W010XXA Fall on same level from slipping, tripping and stumbling without subsequent striking against object, initial encounter: Secondary | ICD-10-CM | POA: Diagnosis not present

## 2024-06-28 DIAGNOSIS — Z5329 Procedure and treatment not carried out because of patient's decision for other reasons: Secondary | ICD-10-CM | POA: Insufficient documentation

## 2024-06-28 DIAGNOSIS — Y990 Civilian activity done for income or pay: Secondary | ICD-10-CM | POA: Insufficient documentation

## 2024-06-28 DIAGNOSIS — M25562 Pain in left knee: Secondary | ICD-10-CM | POA: Insufficient documentation

## 2024-06-28 MED ORDER — HYDROCODONE-ACETAMINOPHEN 5-325 MG PO TABS
1.0000 | ORAL_TABLET | Freq: Once | ORAL | Status: AC
  Administered 2024-06-29: 1 via ORAL
  Filled 2024-06-28: qty 1

## 2024-06-28 NOTE — ED Provider Notes (Signed)
 [place] [facility] AT [facility] Provider Note   CSN: [id number] Arrival date & time: 06/28/24  1910     Patient presents with: Knee Injury   April Watkins is a 43 y.o. female.   HPI   43 year old female presenting to the emergency department with concern for left knee pain.  The patient states that she slipped sustained a mechanical fall and landed right on her left knee just below the kneecap.  She endorses pain with range of motion, bilateral tenderness at the knee.  She has had difficulty bearing weight.  She denies any other injuries or complaints.  She arrives GCS 15, ABC intact.  Prior to Admission medications   Medication Sig Start Date End Date Taking? Authorizing Provider  HYDROcodone -acetaminophen  (NORCO/VICODIN) 5-325 MG tablet Take 1-2 tablets by mouth every 6 (six) hours as needed. 06/29/24  Yes Jerrol Agent, MD  amLODipine  (NORVASC ) 10 MG tablet Take 1 tablet (10 mg total) by mouth daily. 11/06/23   Saguier, [place], PA-C  erythromycin  ophthalmic ointment Place 1 Application into the left eye at bedtime. Place 0.5 cm ribbon into left eye at bedtime for 7 days 01/25/24   Drubel, Manuelita, PA-C  famotidine  (PEPCID ) 20 MG tablet TAKE 1 TABLET(20 MG) BY MOUTH DAILY 03/03/23   Saguier, [place], PA-C  valsartan -hydrochlorothiazide  (DIOVAN -HCT) 160-12.5 MG tablet Take 1 tablet by mouth daily. 11/06/23   Saguier, [place], PA-C    Allergies: Patient has no known allergies.    Review of Systems  All other systems reviewed and are negative.   Updated Vital Signs BP (!) 147/94   Pulse 82   Temp 99 F (37.2 C)   Resp 19   Ht 5' 2 (1.575 m)   LMP 06/25/2024   SpO2 98%   BMI 45.10 kg/m   Physical Exam Vitals and nursing note reviewed.  Constitutional:      General: She is not in acute distress. HENT:     Head: Normocephalic and atraumatic.  Eyes:     Conjunctiva/sclera: Conjunctivae normal.     Pupils: Pupils are equal, round, and reactive to  light.  Cardiovascular:     Rate and Rhythm: Normal rate and regular rhythm.  Pulmonary:     Effort: Pulmonary effort is normal. No respiratory distress.  Abdominal:     General: There is no distension.     Tenderness: There is no guarding.  Musculoskeletal:        General: Tenderness present. No deformity or signs of injury.     Cervical back: Neck supple.     Comments: Tenderness about the proximal tibia, tenderness about the patellar tendon, patient able to passively range but limited by pain  Skin:    Findings: No lesion or rash.  Neurological:     General: No focal deficit present.     Mental Status: She is alert. Mental status is at baseline.     (all labs ordered are listed, but only abnormal results are displayed) Labs Reviewed - No data to display  EKG: None  Radiology: DG Knee Complete 4 Views Left Result Date: 06/28/2024 CLINICAL DATA:  Slipped and fell, pain, limited range of motion EXAM: LEFT KNEE - COMPLETE 4+ VIEW COMPARISON:  None Available. FINDINGS: Frontal, bilateral oblique, lateral views of the left knee are obtained. No acute fracture, subluxation, or dislocation. Joint spaces are well preserved. No joint effusion. Soft tissues are unremarkable. IMPRESSION: 1. Unremarkable left knee. Electronically Signed   By: Ozell Delores HERO.D.  On: 06/28/2024 20:12     Procedures   Medications Ordered in the ED  oxyCODONE -acetaminophen  (PERCOCET/ROXICET) 5-325 MG per tablet 1 tablet (has no administration in time range)  HYDROcodone -acetaminophen  (NORCO/VICODIN) 5-325 MG per tablet 1 tablet (1 tablet Oral Given 06/29/24 0013)                                    Medical Decision Making Amount and/or Complexity of Data Reviewed Radiology: ordered.  Risk Prescription drug management.    43 year old female presenting to the emergency department with concern for left knee pain.  The patient states that she slipped sustained a mechanical fall and landed right on  her left knee just below the kneecap.  She endorses pain with range of motion, bilateral tenderness at the knee.  She has had difficulty bearing weight.  She denies any other injuries or complaints.  She arrives GCS 15, ABC intact.  On arrival, the patient was vitally stable.  Presenting with physical exam as above concerning for potential tibial plateau fracture versus other ligamentous injury to the knee.  Patient presenting after a fall.    X-ray of the left knee was performed and was negative.  Patient was provided with opiates for pain control.  Explained the need for CT imaging to further evaluate, patient declines at this time and would prefer to sign out AGAINST MEDICAL ADVICE.  Explained the risk of missed diagnosis of tibial plateau fracture without CT imaging of the knee.  A knee immobilizer was placed and the patient was provided with crutches to assist with ambulation and the patient was strongly encouraged to follow-up outpatient with orthopedics within the next week for repeat assessment.     Final diagnoses:  Acute pain of left knee    ED Discharge Orders          Ordered    HYDROcodone -acetaminophen  (NORCO/VICODIN) 5-325 MG tablet  Every 6 hours PRN        06/29/24 0051               Jerrol Agent, MD 06/29/24 [phone]

## 2024-06-28 NOTE — ED Triage Notes (Signed)
 Pt reports mechanical fall and landed on L knee.  Now c/o L knee pain, worse with ambulation.

## 2024-06-29 ENCOUNTER — Emergency Department (HOSPITAL_BASED_OUTPATIENT_CLINIC_OR_DEPARTMENT_OTHER)

## 2024-06-29 MED ORDER — HYDROCODONE-ACETAMINOPHEN 5-325 MG PO TABS: 1.0000 | ORAL_TABLET | Freq: Four times a day (QID) | ORAL | 0 refills | Status: AC | PRN

## 2024-06-29 MED ORDER — OXYCODONE-ACETAMINOPHEN 5-325 MG PO TABS
1.0000 | ORAL_TABLET | Freq: Once | ORAL | Status: AC
  Administered 2024-06-29: 1 via ORAL
  Filled 2024-06-29: qty 1

## 2024-06-29 NOTE — Discharge Instructions (Addendum)
 You have been placed in a knee immobilizer and provided with crutches.  Pain medicine has been prescribed.  Please follow-up outpatient with an orthopedist.  Your x-ray imaging was negative however tibial plateau fracture can be missed on initial x-ray imaging.  A CT was recommended but was ultimately declined and you have elected to sign out AGAINST MEDICAL ADVICE.  Despite this, strongly recommend you follow-up outpatient in 1 week's time with an orthopedist.

## 2024-06-29 NOTE — ED Notes (Signed)
 Pt refusing to have CT scan done. Is requesting to get immobilizer and crutches and follow up outpt. EDP made aware

## 2024-08-27 ENCOUNTER — Ambulatory Visit

## 2024-08-27 VITALS — BP 146/95 | HR 90 | Ht 61.0 in | Wt 248.0 lb

## 2024-08-27 DIAGNOSIS — R300 Dysuria: Secondary | ICD-10-CM | POA: Diagnosis not present

## 2024-08-27 LAB — POCT URINALYSIS DIPSTICK
Bilirubin, UA: NEGATIVE
Glucose, UA: NEGATIVE
Nitrite, UA: NEGATIVE
Protein, UA: POSITIVE — AB
Spec Grav, UA: 1.025 (ref 1.010–1.025)
Urobilinogen, UA: NEGATIVE U/dL — AB
pH, UA: 6 (ref 5.0–8.0)

## 2024-08-27 MED ORDER — NITROFURANTOIN MONOHYD MACRO 100 MG PO CAPS: 100.0000 mg | ORAL_CAPSULE | Freq: Two times a day (BID) | ORAL | 1 refills | Status: AC

## 2024-08-27 NOTE — Progress Notes (Signed)
 SUBJECTIVE:  43 y.o. female complains of  dysuria, urinary urgency, and urinary frequency Denies abnormal vaginal bleeding or significant pelvic pain or fever.Denies history of known exposure to STD.  Patient's last menstrual period was 08/08/2024 (approximate).  OBJECTIVE:  She appears alert, well appearing, in no apparent distress Urine dipstick: positive for RBC's, positive for protein, positive for leukocytes, and positive for ketones.  ASSESSMENT:  Dysuria  Urinary urgency and frequency   PLAN:  Urine culture sent to lab.  Rx sent to Pharmacy for Macrobid. Treatment: To be determined once lab results are received ROV prn if symptoms persist or worsen.   Erminio DELENA Rumps, RN

## 2024-09-02 ENCOUNTER — Ambulatory Visit: Payer: Self-pay | Admitting: Obstetrics and Gynecology

## 2024-09-02 LAB — URINE CULTURE
